# Patient Record
Sex: Female | Born: 1965 | Race: White | Hispanic: No | Marital: Single | Smoking: Current every day smoker
Health system: Southern US, Community
[De-identification: ages and names within clinical notes are randomized; demographics above are authoritative.]

---

## 2000-06-26 ENCOUNTER — Other Ambulatory Visit: Admission: RE | Admit: 2000-06-26 | Discharge: 2000-06-26 | Payer: Self-pay | Admitting: *Deleted

## 2000-09-25 ENCOUNTER — Encounter: Admission: RE | Admit: 2000-09-25 | Discharge: 2000-12-24 | Payer: Self-pay | Admitting: Gynecology

## 2000-12-07 ENCOUNTER — Inpatient Hospital Stay (HOSPITAL_COMMUNITY): Admission: AD | Admit: 2000-12-07 | Discharge: 2000-12-10 | Payer: Self-pay | Admitting: Gynecology

## 2001-01-23 ENCOUNTER — Other Ambulatory Visit: Admission: RE | Admit: 2001-01-23 | Discharge: 2001-01-23 | Payer: Self-pay | Admitting: Gynecology

## 2001-06-15 ENCOUNTER — Inpatient Hospital Stay (HOSPITAL_COMMUNITY): Admission: EM | Admit: 2001-06-15 | Discharge: 2001-07-02 | Payer: Self-pay

## 2001-06-15 ENCOUNTER — Encounter: Payer: Self-pay | Admitting: Neurosurgery

## 2001-06-15 ENCOUNTER — Encounter: Payer: Self-pay | Admitting: Emergency Medicine

## 2001-06-19 ENCOUNTER — Encounter: Payer: Self-pay | Admitting: Pulmonary Disease

## 2001-06-30 ENCOUNTER — Encounter: Payer: Self-pay | Admitting: Neurosurgery

## 2001-08-25 ENCOUNTER — Ambulatory Visit (HOSPITAL_COMMUNITY): Admission: RE | Admit: 2001-08-25 | Discharge: 2001-08-25 | Payer: Self-pay | Admitting: Interventional Radiology

## 2001-10-06 ENCOUNTER — Ambulatory Visit (HOSPITAL_COMMUNITY): Admission: RE | Admit: 2001-10-06 | Discharge: 2001-10-06 | Payer: Self-pay | Admitting: Interventional Radiology

## 2001-10-09 ENCOUNTER — Ambulatory Visit (HOSPITAL_COMMUNITY): Admission: RE | Admit: 2001-10-09 | Discharge: 2001-10-09 | Payer: Self-pay | Admitting: Interventional Radiology

## 2001-10-21 ENCOUNTER — Ambulatory Visit: Admission: RE | Admit: 2001-10-21 | Discharge: 2001-10-21 | Payer: Self-pay | Admitting: Interventional Radiology

## 2001-10-30 ENCOUNTER — Ambulatory Visit (HOSPITAL_COMMUNITY): Admission: RE | Admit: 2001-10-30 | Discharge: 2001-10-30 | Payer: Self-pay | Admitting: Interventional Radiology

## 2001-12-08 ENCOUNTER — Ambulatory Visit (HOSPITAL_COMMUNITY): Admission: RE | Admit: 2001-12-08 | Discharge: 2001-12-08 | Payer: Self-pay | Admitting: Interventional Radiology

## 2002-02-09 ENCOUNTER — Inpatient Hospital Stay (HOSPITAL_COMMUNITY): Admission: RE | Admit: 2002-02-09 | Discharge: 2002-02-10 | Payer: Self-pay | Admitting: Interventional Radiology

## 2002-04-14 ENCOUNTER — Ambulatory Visit (HOSPITAL_COMMUNITY): Admission: RE | Admit: 2002-04-14 | Discharge: 2002-04-15 | Payer: Self-pay | Admitting: Interventional Radiology

## 2002-07-17 ENCOUNTER — Ambulatory Visit (HOSPITAL_COMMUNITY): Admission: RE | Admit: 2002-07-17 | Discharge: 2002-07-17 | Payer: Self-pay | Admitting: Interventional Radiology

## 2002-12-02 ENCOUNTER — Inpatient Hospital Stay (HOSPITAL_COMMUNITY): Admission: RE | Admit: 2002-12-02 | Discharge: 2002-12-03 | Payer: Self-pay | Admitting: Interventional Radiology

## 2003-01-11 IMAGING — XA IR ANGIO/VERTEBRAL*L*
1 series · 12 of 24 positions shown · IV contrast (omnipaque)
Comparison: none

FINDINGS
CLINICAL DATA: PATIENT WITH A SEVERE ACUTE ONSET OF HEADACHES.  SUBARACHNOID HEMORRHAGE BY CT SCAN
OF THE BRAIN.
CAROTID AND CEREBRAL ARTERIOGRAM FOLLOWED BY ENDOVASCULAR OCCLUSION FOR A RIGHT PCOM ANEURYSM:
FOLLOWING A FULL EXPLANATION OF THE PROCEDURE ALONG WITH THE POTENTIAL ASSOCIATED COMPLICATIONS, AN
INFORMED WITNESSED CONSENT WAS OBTAINED FROM THE PATIENT'S HUSBAND.
 THE RIGHT GROIN WAS PREPPED AND DRAPED IN THE USUAL STERILE FASHION.  THEREAFTER, USING  MODIFIED
SELDINGER TECHNIQUE, TRANSFEMORAL ACCESS INTO THE RIGHT COMMON FEMORAL ARTERY WAS OBTAINED WITHOUT
DIFFICULTY.  OVER AN .035 INCH GUIDEWIRE, A SIX FRENCH PINNACLE SHEATH WAS INSERTED.  THROUGH THIS
AND ALSO OVER A .035 INCH GUIDEWIRE, A FIVE FRENCH JB-1 CATHETER WAS ADVANCED INTO THE AORTIC ARCH
REGION AND SELECTIVE CANNULATION ARTERIOGRAMS WERE PERFORMED OF THE RIGHT COMMON CAROTID ARTERY,
THE RIGHT VERTEBRAL ARTERY, THE LEFT COMMON CAROTID ARTERY, AND THE LEFT VERTEBRAL ARTERY, IN THAT
ORDER.  THERE WERE NO ACUTE COMPLICATIONS.
MEDICATIONS UTILIZED:  VERSED 1 MG IV X 1 AND FENTANYL 25 MG IV X 1 FOR SEDATION.
CONTRAST UTILIZED:  OMNIPAQUE 300, APPROXIMATELY 75 CC.

[Series 1: run · 12 of 198 slices shown]
[im 9/198]
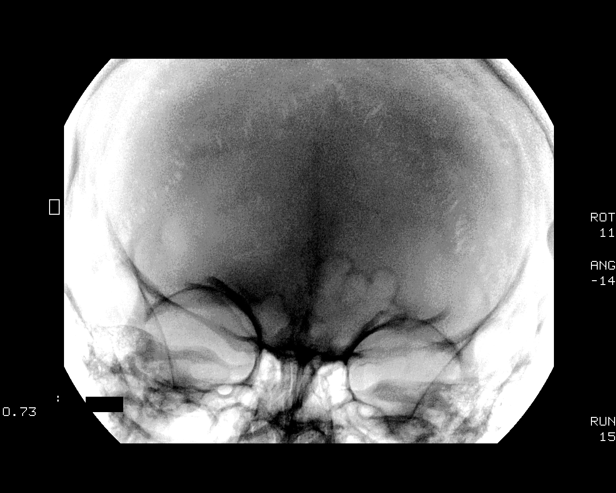
[im 26/198]
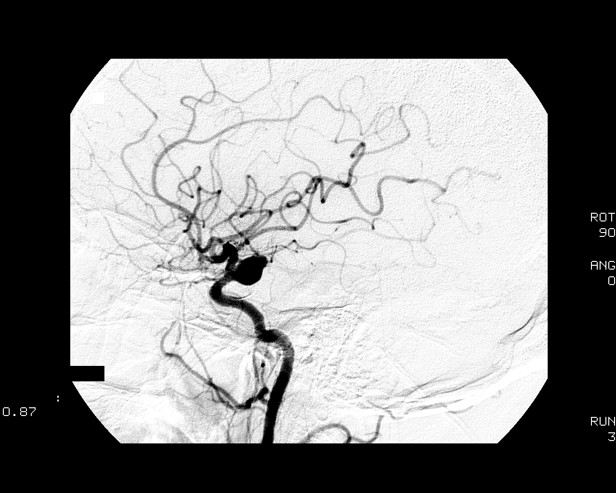
[im 43/198]
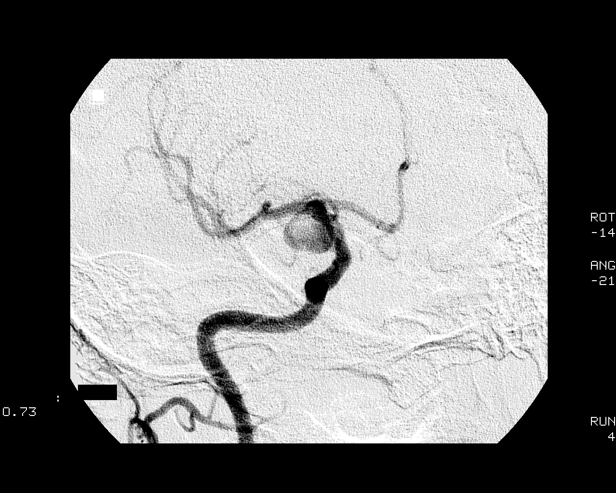
[im 60/198]
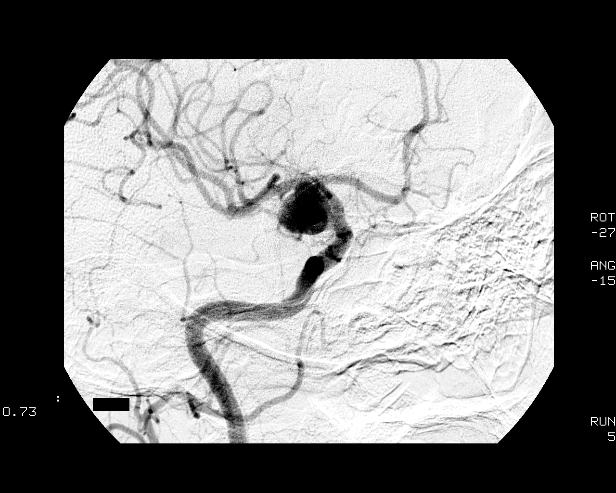
[im 78/198]
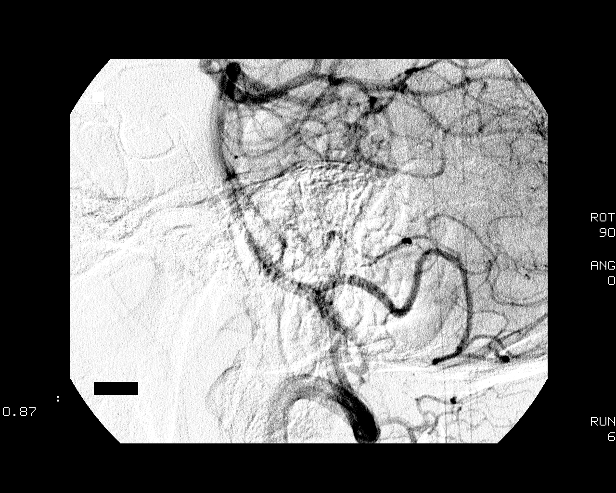
[im 95/198]
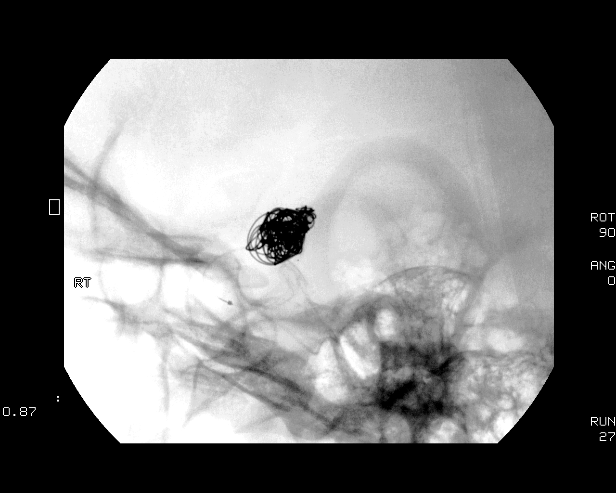
[im 112/198]
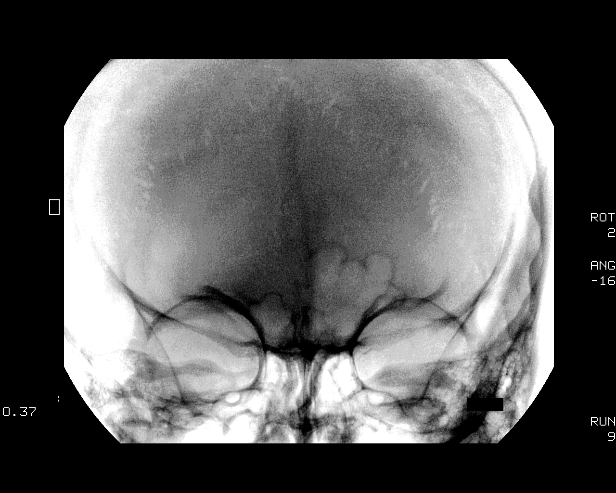
[im 129/198]
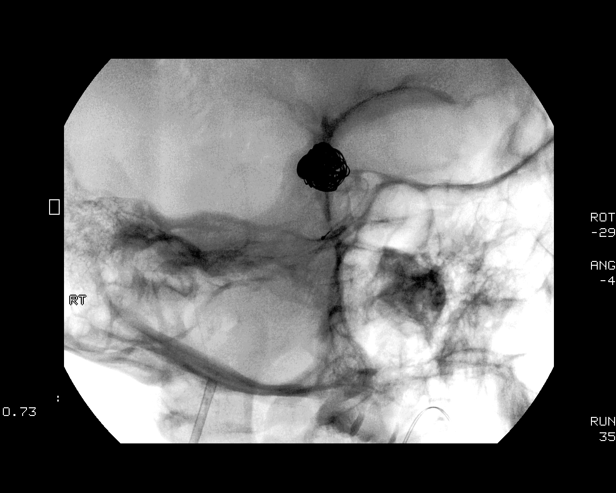
[im 146/198]
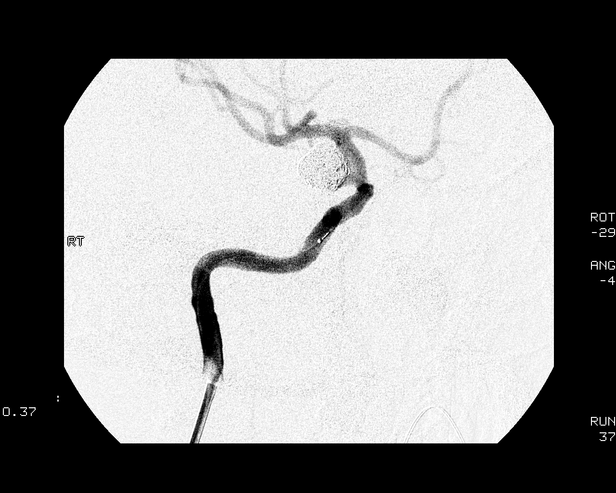
[im 163/198]
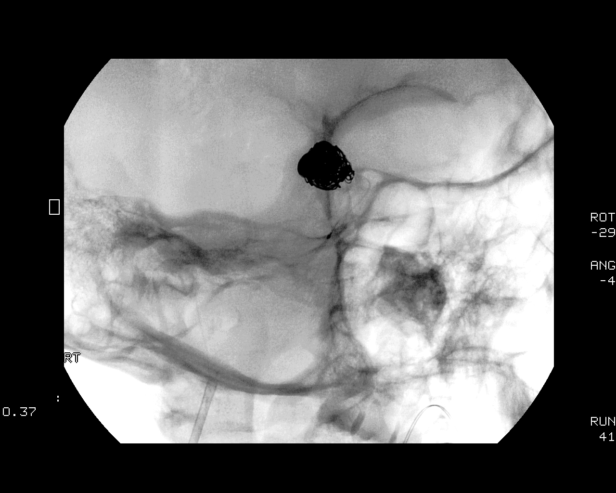
[im 180/198]
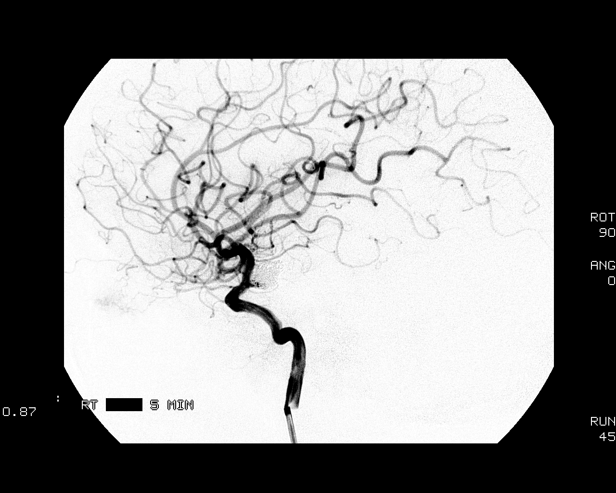
[im 198/198]
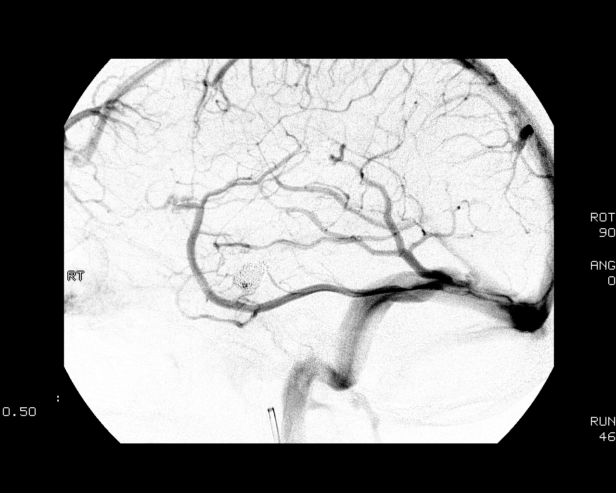

[12 of 24 positions shown; findings below may reference images not displayed]

FINDINGS: THE RIGHT VERTEBRAL  ARTERY ORIGIN IS NORMAL.  THE VESSEL ASCENDS NORMALLY  TO  THE CRANIAL SKULL
BASE. THE RIGHT POSTERIOR INFERIOR CEREBELLAR ARTERY, THE BASILAR ARTERY, THE POSTERIOR CEREBRAL
ARTERIES, THE SUPERIOR CEREBRAL ARTERIES, AND THE ANTERIOR INFERIOR CEREBELLAR ARTERIES ARE
NORMALLY OPACIFIED INTO THE CAPILLARY AND THE VENOUS PHASES.
THE RIGHT COMMON CAROTID ARTERIOGRAM DEMONSTRATES THE RIGHT EXTERNAL CAROTID ARTERY ORIGIN AND
BRANCHES TO BE NORMAL. THE RIGHT INTERNAL CAROTID ARTERY AT THE BULB AND DISTALLY IS NORMAL.  THE
PETROUS, CAVERNOUS, AND THE SUPRACLINOID SEGMENTS ARE UNREMARKABLE.
IN THE RIGHT PCOM REGION, THERE IS A LARGE UNILOBULAR SACCULAR ANEURYSM PROJECTING POSTERIORLY AND
SLIGHTLY LATERALLY.  THE RIGHT MIDDLE AND THE RIGHT ANTERIOR CEREBRAL ARTERIES HOWEVER, DEMONSTRATE
NORMAL CALIBER AND OPACIFICATION INTO THE CAPILLARY AND THE VENOUS PHASES. THERE IS NO CROSS
OPACIFICATION OF THE LEFT ANTERIOR CEREBRAL ARTERY DISTRIBUTION VIA THE ACOM.
THE LEFT CAROTID ARTERIOGRAM DEMONSTRATES THE LEFT EXTERNAL CAROTID ARTERY ORIGIN AND BRANCHES TO
BE NORMAL.
THE LEFT INTERNAL CAROTID ARTERY AT THE BULB AND DISTALLY IS NORMAL.  THE PETROUS AND CAVERNOUS
SEGMENTS ARE NORMAL.
IN THE LEFT PCOM REGION, THERE IS A 3 MM SACCULAR ANEURYSM PROJECTING POSTERIORLY AND SLIGHTLY
INFERIORLY.  THE LEFT SUPRACLINOID SEGMENT IS OTHERWISE NORMAL. THE LEFT MIDDLE AND THE LEFT
ANTERIOR CEREBRAL ARTERIES OPACIFY NORMALLY INTO THE CAPILLARY AND THE VENOUS PHASES.
THE LEFT VERTEBRAL ARTERY ORIGIN IS NORMAL. THE VESSEL ASCENDS NORMALLY TO THE CRANIAL SKULL BASE.
THE LEFT POSTERIOR INFERIOR CEREBELLAR ARTERY, THE BASILAR ARTERY, THE POSTERIOR CEREBRAL ARTERIES,
THE SUPERIOR CEREBELLAR ARTERIES, AND THE ANTERIOR INFERIOR CEREBELLAR ARTERIES ARE NORMALLY
OPACIFIED INTO THE CAPILLARY AND THE VENOUS PHASES.
THE ABOVE FINDINGS WERE DISCUSSED WITH DR. SEIFU HAJI YUYE AND THE PATIENT'S FAMILY.  SINCE THE ANEURYSM
WAS AMENABLE TO ENDOVASCULAR TREATMENT, THIS WAS OPTED FOR.
THE RISKS OF THE PROCEDURE INCLUDING THE BENEFITS WERE DISCUSSED WITH THE PATIENT'S HUSBAND.
ENDOVASCULAR OCCLUSION OF  RIGHT PCOM ARTERY ANEURYSM.
THE PATIENT WAS THEN PUT UNDER GENERAL ANESTHESIA BY THE [REDACTED] WITH
INTUBATION.
THE SIX FRENCH PINNACLE SHEATH IN THE RIGHT GROIN WAS THEN EXCHANGED FOR A SIX FRENCH, 45 CM
NEUROVASCULAR ARROW SHEATH OVER A .035 INCH GUIDEWIRE.
THIS WAS THEN CONNECTED TO CONTINUOUS HEPARIN AND SALINE INFUSION.
THROUGH THIS AND ALSO OVER A .035 INCH GUIDEWIRE, A FIVE FRENCH DIAGNOSTIC CATHETER WAS THEN
ADVANCED TO THE AORTIC ARCH REGION AND POSITIONED IN THE RIGHT COMMON CAROTID ARTERY OVER A .035
INCH GUIDEWIRE.
USING BIPLANE ROAD MAP TECHNIQUE AND WITH CONSTANT FLUOROSCOPIC GUIDANCE, THE DIAGNOSTIC CATHETER
WAS EXCHANGED FOR A SIX FRENCH STRAIGHT ENVOY, 90 CM GUIDE CATHETER OVER A .035 INCH 300 CM ROSEN
EXCHANGE GUIDEWIRE.
THE GUIDEWIRE WAS REMOVED AND A CONTROLLED ARTERIOGRAM THROUGH THE GUIDE CATHETER DEMONSTRATED NO
EVIDENCE OF SPASM, DISSECTIONS, OR INTRALUMINAL FILLING DEFECTS.
USING THE BIPLANE ROAD MAP TECHNIQUE AND WITH CONSTANT FLUOROSCOPIC GUIDANCE, OVER A .035 INCH
ROADRUNNER GUIDEWIRE, THE GUIDE CATHETER WAS ADVANCED INTO THE DISTAL ASPECT OF THE LEFT INTERNAL
CAROTID ARTERY.  THE GUIDEWIRE WAS REMOVED AND CONTROLLED ARTERIOGRAM DEMONSTRATED NO EVIDENCE OF
SPASM, DISSECTIONS, OR INTRALUMINAL FILLING DEFECTS.  THE INTRACRANIAL CIRCULATION REMAINED
UNCHANGED.
A QUARTER WAS THEN POSITIONED OVER THE RIGHT ORBIT.  THIS WAS THEN USED TO MEASURE THE DIMENSIONS
OF THE ANEURYSM.  THE ANEURYSM MEASURED APPROXIMATELY 12 MM X 8.4 MM X 11 MM.  IT HAD A NECK OF
APPROXIMATELY 2.7 MM.
AN EXCEL [id number], 2-TIP MICROCATHETER WAS THEN STEAM SHAPED IN THE USUAL FASHION.  OVER A .014 INCH
TRANSEND EX MICROGUIDEWIRE, THE COMBINATION OF THE MICROCATHETER AND THE  MICROGUIDEWIRE WITH A J-
TIP CONFIGURATION WAS ADVANCED INTO THE DISTAL ASPECT OF THE GUIDE CATHETER USING BIPLANE ROAD MAP
TECHNIQUE, IN A COAXIAL MANNER AND WITH CONSTANT HEPARINIZED SALINE INFUSION.
THE TWO WERE NAVIGATED THEREAFTER INTO THE DISTAL ASPECT OF THE CERVICAL INTERNAL CAROTID ARTERY,
THE CAVERNOUS SEGMENT, AND THE SUPRACLINOID SEGMENT WITHOUT ANY DIFFICULTY.  THE ANEURYSM WAS
ENTERED WITH THE MICROGUIDEWIRE LEADING.  THE MICROCATHETER WAS THEN ADVANCED OVER THE
MICROGUIDEWIRE AND POSITIONED WITHIN THE MIDDLE OF THE ANEURYSM.  THE MICROGUIDEWIRE WAS SLOWLY
REMOVED.  THERE WAS GOOD ASPIRATION OF BLOOD FROM THE MICROCATHETER. A CONTROLLED ARTERIOGRAM WAS
THEN PERFORMED THROUGH THE GUIDE CATHETER WHICH DEMONSTRATED SAFE POSITION OF THE MICROCATHETER TIP
FOR COILING TO BEGIN.
THE FIRST COIL UTILIZED WAS [REDACTED] 10, 3-D 10 MM X 30 CM.  THIS WAS PREPPED AND FLUSHED IN THE USUAL
FASHION.  IN A COAXIAL MANNER, THE COIL WAS ADVANCED USING BIPLANE ROAD MAP TECHNIQUE AND WITH
CONSTANT FLUOROSCOPIC GUIDANCE. THE COIL WAS ADVANCED PAST THE MICROCATHETER TIP, AND A  BASKET
WAS FORMED.  A CONTROLLED ARTERIOGRAM WAS THEN PERFORMED THROUGH THE GUIDE CATHETER, WHICH
DEMONSTRATED NO EVIDENCE OF COIL PROTRUDING THROUGH THE NECK OF THE ANEURYSM.
THIS WAS THEN DETACHED WITHOUT ANY DIFFICULTY.  THIS WAS THEN FOLLOWED BY [REDACTED] 10, 3-D 8 MM X 20
CM COIL, [REDACTED] 10, 2-D TO 7 MM X 25 CM, [REDACTED] 10, 2-D 5 MM X 15 CM, AND [REDACTED] 10, SOFT 2-D STRETCH
RESISTANT 5 MM X 10 CM, [REDACTED] 10 SOFT 2-D SR 4 MM X 8 CM, A 2-D STRETCH RESISTANT [REDACTED] 4 MM X 6 CM,
[REDACTED] 2-D SR, 3 MM X 8 CM, [REDACTED] 10 2-D SR 3 MM X 6 CM, [REDACTED] ULTRASOFT 2.5 MM X 6 CM, [REDACTED]
ULTRASOFT 2.5 MM  X 6 CM, [REDACTED] ULTRASOFT 2.5 MM X 4 CM, [REDACTED] 10, 2-D SR 3 MM X 6 CM, [REDACTED] 10
SOFT 2-D SR 3 MM X 6 CM, [REDACTED] ULTRASOFT 2 MM X 6 CM, [REDACTED] 10 SOFT 2-D SR 3 MM X 4 CM, AND
FINALLY, [REDACTED] 2 MM X 6 CM ULTRASOFT COIL.  THE ATTEMPT AT POSITIONING A SUBSEQUENT 2 MM X 3 CM
COIL RESULTED IN RESISTANCE WITH BACKING OUT OF THE MICROCATHETER FROM THE ANEURYSM.
THIS COIL WAS THEREFORE NOT DETACHED.
EACH OF THE COILS WAS ADVANCED INTO THE ANEURYSM USING BIPLANE ROAD MAP TECHNIQUE AND CONSTANT
FLUOROSCOPIC GUIDANCE.  PRIOR TO THE DETACHMENT OF EACH COIL, A CONTROLLED ARTERIOGRAM WAS
PERFORMED THOROUGH THE GUIDE CATHETER TO ENSURE THE SAFE POSITIONING OF THE [REDACTED] COILS FOR
DEPLOYMENT.
THE FINAL CONTROLLED ARTERIOGRAM PERFORMED THROUGH THE GUIDE CATHETER DEMONSTRATED NEAR COMPLETE
OBLITERATION OF THE ANEURYSM.  THERE WAS A MINIMAL AMOUNT OF CONTRAST NOTED STAGNANT IN THE
INFERIOR ASPECT OF THE NECK OF THE ANEURYSM WITH DELAYED CLEARANCE.
USING BIPLANE ROAD MAP TECHNIQUE AND WITH CONSTANT FLUOROSCOPIC GUIDANCE, THE MICROCATHETER WAS
REMOVED.  THE GUIDE CATHETER WAS POSITIONED IN THE RIGHT COMMON CAROTID ARTERY.  AN ARTERIOGRAM
PERFORMED THROUGH THIS DEMONSTRATED NO EVIDENCE OF INTRALUMINAL FILLING DEFECTS IN THE ANTERIOR
CIRCULATION.  NO EVIDENCE OF DISSECTIONS WERE NOTED.  AGAIN NOTED WAS THE VERY MINIMAL AMOUNT OF
CONTRAST IN THE INFERIOR ASPECT OF THE NECK OF THE ANEURYSM WHICH REMAINS STAGNANT WITH DELAYED
CLEARANCE.
THE GUIDE CATHETER WAS REMOVED AND A SHORT SIX FRENCH PINNACLE SHEATH WAS THEN INSTITUTED FOR THE
LONG 45 CM NEUROVASCULAR SHEATH.
THIS WAS THEN CONNECTED TO CONTINUOUS HEPARINIZED SALINE INFUSION.
THE PATIENT WAS THEN TAKEN TO THE CT SCAN FOR A HEAD CT.
MEDICATIONS UTILIZED DURING THE PROCEDURE:  NO HEPARIN WAS UTILIZED.  OTHERWISE MEDICATIONS AS PER
ANESTHESIA.
IMPRESSION
LARGE, UNILOCULAR RIGHT PCOM ARTERY ANEURYSM AS DESCRIBED ABOVE.
APPROXIMATELY 3 MM X 3 MM LEFT PCOM ARTERY ANEURYSM.
NO ANGIOGRAPHIC EVIDENCE OF VASOSPASM NOTED.
STATUS POST  ENDOVASCULAR  OCCLUSION OF THE RIGHT PCOM ANEURYSM AS DESCRIBED WITHOUT EVENT.

## 2003-05-04 IMAGING — XA IR ANGIO/CAROTID/CERV BI
1 series · 12 of 24 positions shown · IV contrast (omnipaque)
Comparison: none

FINDINGS
CLINICAL DATA: PATIENT WITH INTRACRANIAL ANEURYSMS STATUS POST ENDOVASCULAR OCCLUSION OF ONE
ANEURYSM, EVALUATE THE OTHER ANEURYSM FOR CHANGES.
CAROTID AND CEREBRAL ARTERIOGRAMS:
FOLLOWING A FULL EXPLANATION OF THE PROCEDURE, ALONG WITH THE POTENTIAL ASSOCIATED COMPLICATIONS,
AN INFORMED WITNESSED CONSENT WAS OBTAINED.
THE RIGHT GROIN WAS PREPPED AND DRAPED IN THE USUAL STERILE FASHION.  THEREAFTER USING THE MODIFIED
SELDINGER TECHNIQUE, TRANSFEMORAL ACCESS INTO THE RIGHT COMMON FEMORAL ARTERY WAS ACCESSED WITHOUT
DIFFICULTY.  OVER A .035 INCH GUIDEWIRE, A 5 FRENCH PINNACLE SHEATH WAS INSERTED.
THROUGH THIS, AND ALSO OVER A .035 INCH GUIDEWIRE, A 5 FRENCH JB-I CATHETER WAS ADVANCED INTO THE
AORTIC ARCH REGION AND SELECTIVE CANNULATION ARTERIOGRAMS WERE PERFORMED OF THE RIGHT COMMON
CAROTID AND THE LEFT COMMON CAROTID ARTERY. THERE WERE NO ACUTE COMPLICATIONS AND THE PATIENT
TOLERATED THE PROCEDURE WELL.
MEDICATIONS UTILIZED:  VERSED 1 MG IV, FENTANYL 25 MG IV.
CONTRAST UTILIZED:  OMNIPAQUE 300, APPROXIMATELY 50 CC.

[Series 1: run · 12 of 49 slices shown]
[im 3/49]
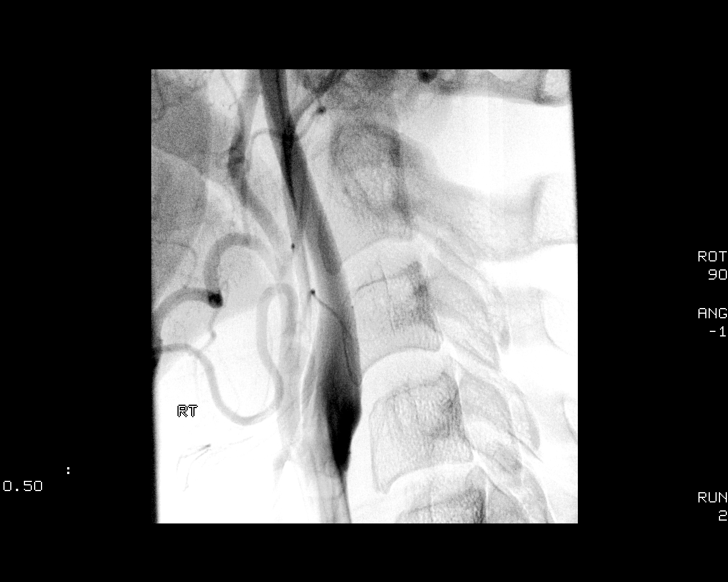
[im 7/49]
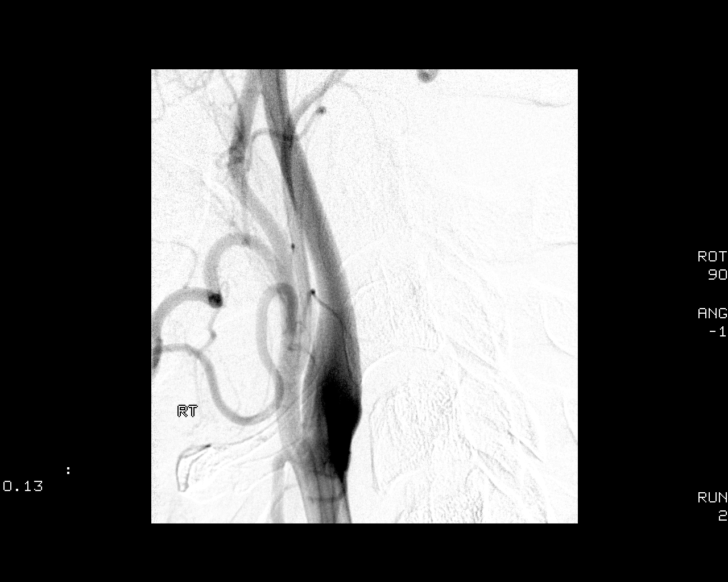
[im 11/49]
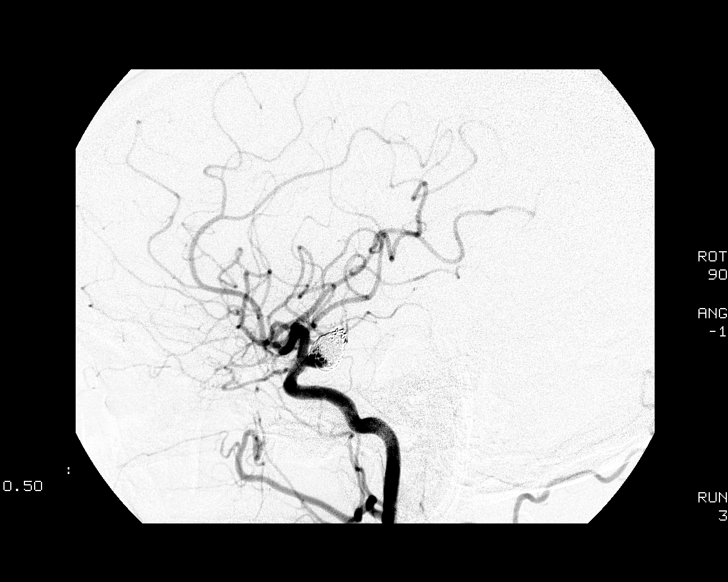
[im 15/49]
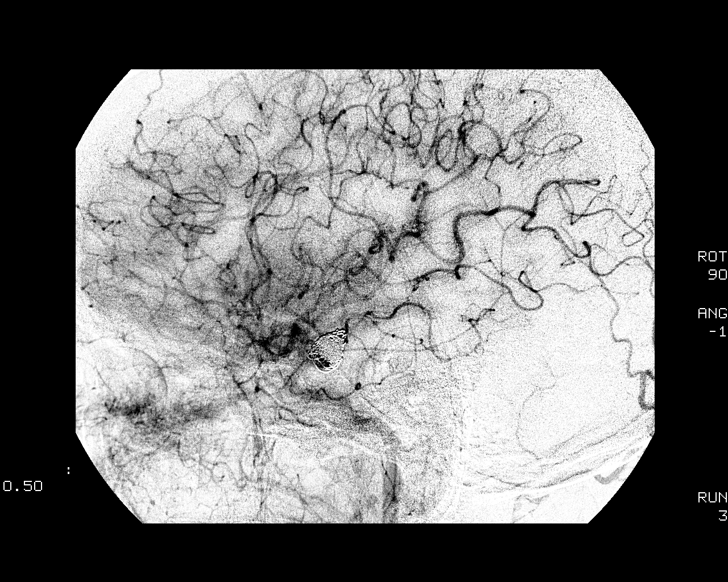
[im 19/49]
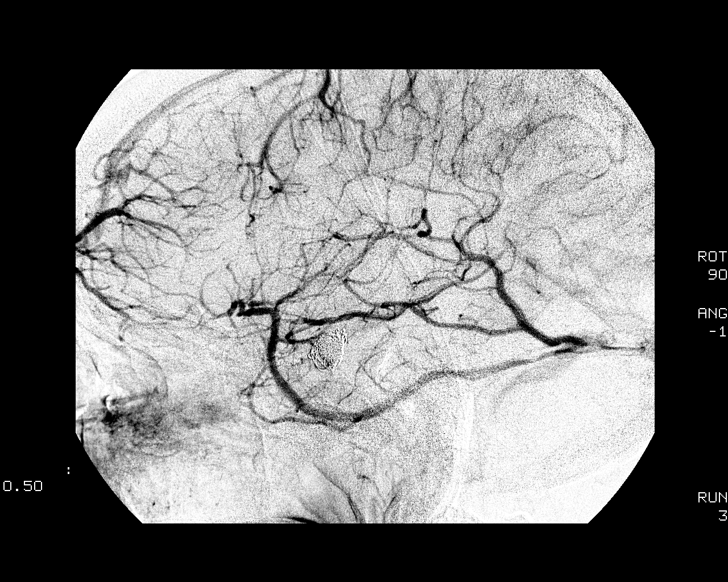
[im 23/49]
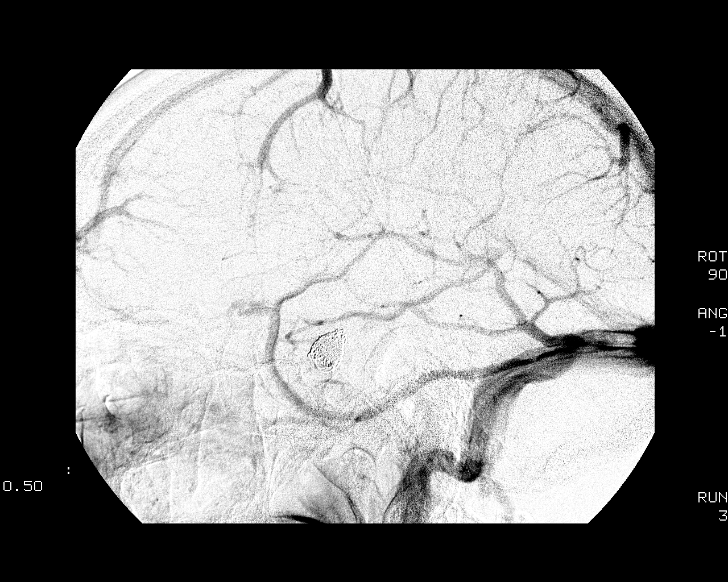
[im 28/49]
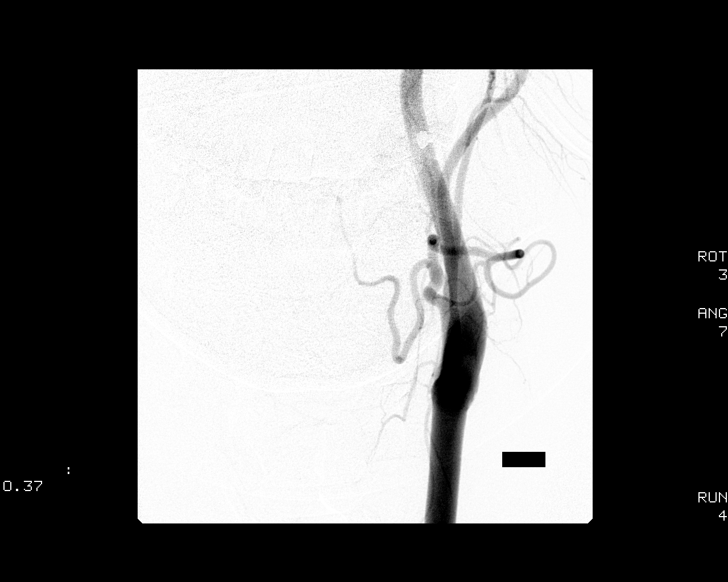
[im 32/49]
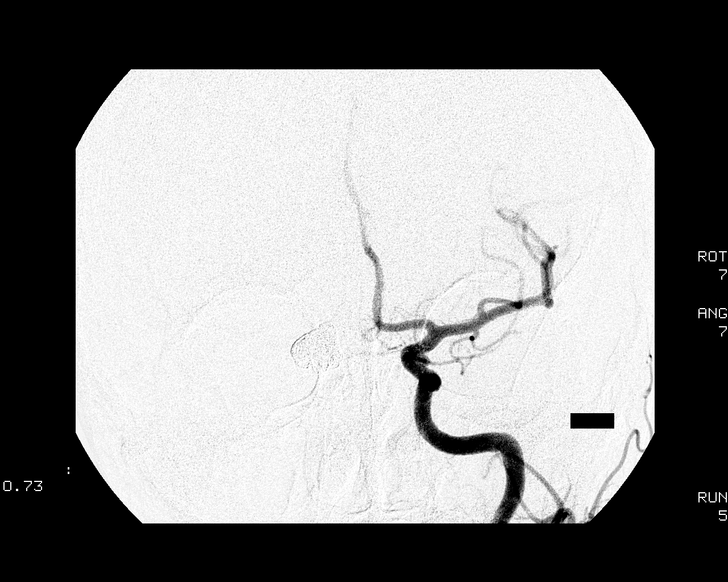
[im 36/49]
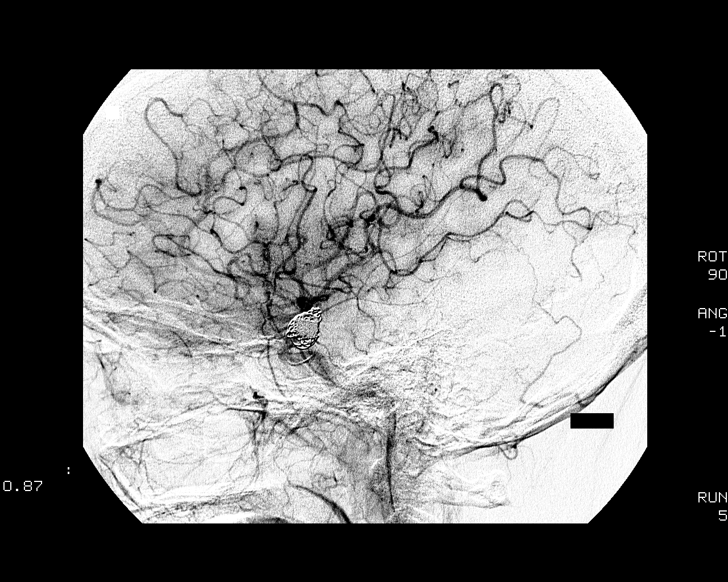
[im 40/49]
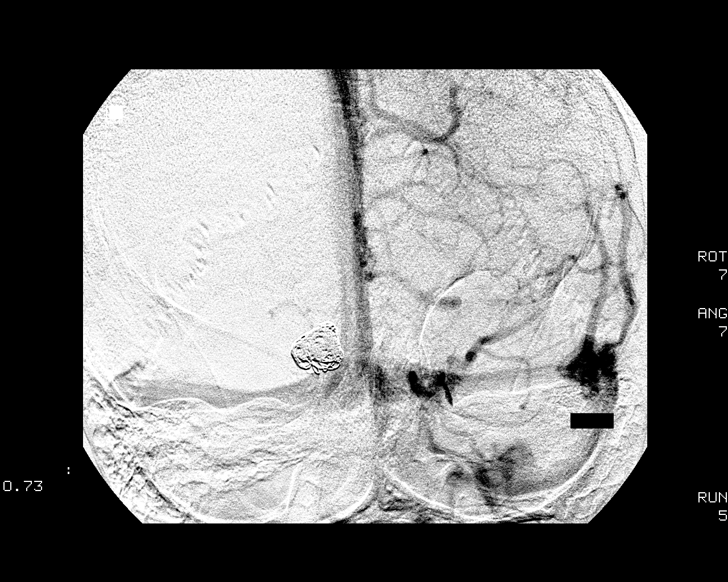
[im 44/49]
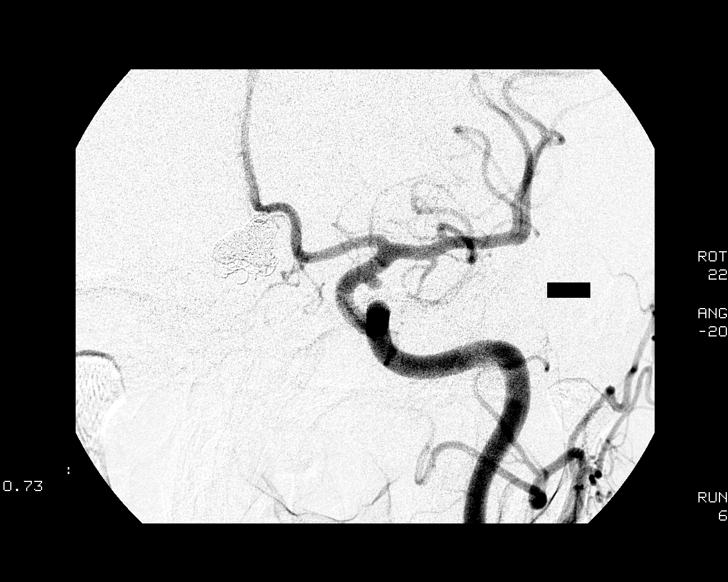
[im 49/49]
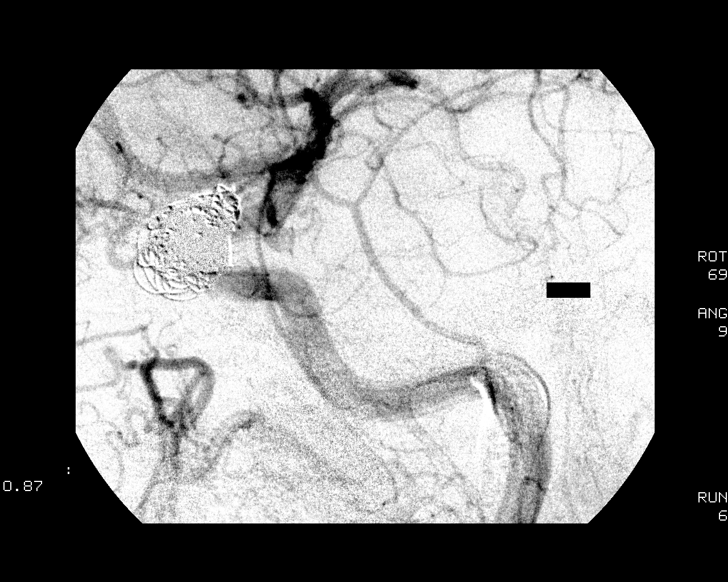

[12 of 24 positions shown; findings below may reference images not displayed]

FINDINGS: THE RIGHT COMMON CAROTID ARTERIOGRAM  DEMONSTRATES THE RIGHT EXTERNAL CAROTID ARTERY
ORIGIN AND BRANCHES TO BE NORMAL.  THE RIGHT INTERNAL CAROTID ARTERY ASCENDS NORMALLY INTO THE
CRANIAL SKULL BASE.   IN THE LEFT PCOM REGION, THERE IS AN APPROXIMATELY 4 MM TUBULAR OPACIFICATION
AT THE NECK OF THE PREVIOUSLY COILED LARGE PCOM ARTERY ANEURYSM.  THE VESSELS DISTAL TO THIS ARE
NORMAL.  THE  MIDDLE AND THE ANTERIOR CEREBRAL ARTERIES ARE NORMAL INTO THE CAPILLARY AND VENOUS
PHASES.
THE LEFT COMMON CAROTID ARTERIOGRAM DEMONSTRATES THE LEFT EXTERNAL CAROTID ARTERY AND BRANCHES TO
BE NORMAL.
THE LEFT INTERNAL CAROTID ARTERY OF THE BULB AND DISTALLY IS NORMAL.  THE LEFT PETROUS AND THE
CAVERNOUS SEGMENTS ARE NORMAL.  IN THE LEFT PCOM REGION, THERE IS A 2.5 TO 3 MM PCOM ARTERY
ANEURYSM WITH A RELATIVELY WIDE NECK.  THE VESSEL IS UNCHANGED FROM THE PREVIOUS EXAMINATION.
THE LEFT MIDDLE AND THE LEFT ANTERIOR CEREBRAL ARTERIES OPACIFY NORMALLY INTO THE CAPILLARY AND
VENOUS PHASES.
IMPRESSION
PARTIAL OPACIFICATION AT THE NECK OF THE PREVIOUSLY TREATED RIGHT PCOM ARTERY ANEURYSM POSSIBLY
RELATED TO COIL COMPACTION.
NO CHANGE IN THE 2.5 TO 3 MM LEFT PCOM ARTERY ANEURYSM.

## 2003-07-23 ENCOUNTER — Ambulatory Visit (HOSPITAL_COMMUNITY): Admission: RE | Admit: 2003-07-23 | Discharge: 2003-07-23 | Payer: Self-pay | Admitting: Interventional Radiology

## 2003-07-28 ENCOUNTER — Ambulatory Visit (HOSPITAL_COMMUNITY): Admission: RE | Admit: 2003-07-28 | Discharge: 2003-07-28 | Payer: Self-pay | Admitting: Interventional Radiology

## 2003-08-10 ENCOUNTER — Ambulatory Visit (HOSPITAL_COMMUNITY): Admission: RE | Admit: 2003-08-10 | Discharge: 2003-08-10 | Payer: Self-pay | Admitting: Interventional Radiology

## 2004-02-14 ENCOUNTER — Ambulatory Visit (HOSPITAL_COMMUNITY): Admission: RE | Admit: 2004-02-14 | Discharge: 2004-02-14 | Payer: Self-pay | Admitting: Interventional Radiology

## 2004-08-28 ENCOUNTER — Ambulatory Visit (HOSPITAL_COMMUNITY): Admission: RE | Admit: 2004-08-28 | Discharge: 2004-08-28 | Payer: Self-pay | Admitting: Interventional Radiology

## 2004-09-03 ENCOUNTER — Emergency Department (HOSPITAL_COMMUNITY): Admission: EM | Admit: 2004-09-03 | Discharge: 2004-09-03 | Payer: Self-pay | Admitting: Emergency Medicine

## 2005-04-09 ENCOUNTER — Ambulatory Visit (HOSPITAL_COMMUNITY): Admission: RE | Admit: 2005-04-09 | Discharge: 2005-04-09 | Payer: Self-pay | Admitting: Interventional Radiology

## 2006-04-15 ENCOUNTER — Ambulatory Visit (HOSPITAL_COMMUNITY): Admission: RE | Admit: 2006-04-15 | Discharge: 2006-04-15 | Payer: Self-pay | Admitting: Interventional Radiology

## 2008-04-13 ENCOUNTER — Ambulatory Visit (HOSPITAL_COMMUNITY): Admission: RE | Admit: 2008-04-13 | Discharge: 2008-04-13 | Payer: Self-pay | Admitting: Interventional Radiology

## 2008-04-15 ENCOUNTER — Encounter: Payer: Self-pay | Admitting: Interventional Radiology

## 2009-05-06 ENCOUNTER — Ambulatory Visit (HOSPITAL_COMMUNITY): Admission: RE | Admit: 2009-05-06 | Discharge: 2009-05-06 | Payer: Self-pay | Admitting: Interventional Radiology

## 2011-02-06 NOTE — Consult Note (Signed)
NAME:  Funes, Jalaine                   ACCOUNT NO.:  [id number]   MEDICAL RECORD NO.:  [id number]          PATIENT TYPE:  OUT   LOCATION:  XRAY                         FACILITY:  [facility]   PHYSICIAN:  Sanjeev K. Deveshwar, M.D.DATE OF BIRTH:  May 18, 1966   DATE OF CONSULTATION:  04/15/2008  DATE OF DISCHARGE:  04/15/2008                                 CONSULTATION   DATE OF CONSULT:  April 15, 2008   CHIEF COMPLAINT:  Cerebral aneurysm.   HISTORY OF PRESENT ILLNESS:  This is a very pleasant 45 year old female  who was initially referred to Dr. Corliss Skains through the courtesy of Dr.  Trey Sailors in September 2002 when the patient presented with a  subarachnoid hemorrhage.  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  She had a repeat coiling of  this aneurysm in January 2003.  In July 2003, she had a repeat coiling  with stent placement.  In March 2004, she underwent stent-assisted  coiling of a left posterior communicating artery aneurysm that was noted  at the time of the initial subarachnoid hemorrhage.  The patient has  been followed over the years with angiograms and MRIs.  She returns  today to discuss the results of her most recent MRI, which was performed  on April 13, 2008.  She is accompanied by her husband and her son.   PAST MEDICAL HISTORY:  Significant for the above-noted subarachnoid  hemorrhage and the aneurysm coilings.  She has otherwise been healthy.   PAST SURGICAL HISTORY:  The patient is status post tubal ligation.  She  has had nausea in the past associated with anesthesia.   CURRENT MEDICATIONS:  The patient is not on any medications.   ALLERGIES:  She has no known drug allergies.   SOCIAL HISTORY:  The patient is married.  She has 2 children.  She lives  in [place] with her husband.  She continues to smoke a pack of  cigarettes per day and has done so for 20-30 years.  She has an  occasional glass of wine.  She is a  housewife.   FAMILY HISTORY:  The patient's mother is alive and well in her 79s.  The  patient's father is alive and well in his 6s.  She has no siblings.   IMPRESSION AND PLAN:  As noted, the patient presents today for further  followup regarding her cerebral aneurysms.  She recently had an MRI/MRA  performed on April 13, 2008.  This did show an enlargement of the neck of  the right posterior communicating artery aneurysm.  It was estimated at  4.5 mm x 3.1 mm.  Her previous MRI/MRA performed in July 2007 had also  shown a small neck remnant; however, it measured 1.5 mm at that time, so  there has been a significant increase in its size.   The patient has been asymptomatic.  She has occasional headaches, but  she believes these are associated with changes in the weather.  She has  had no neurologic symptoms.   As noted, the  patient does continue to smoke.  Dr. Corliss Skains has  recommended that she quit smoking.  He also feels that no further  intervention is indicated at this time.  He recommends repeating an  MRI/MRA in approximately 1-year, unless the patient develops new  neurologic-type symptoms.  She has been told to call us if she has any  new symptoms.  She also will monitor her headaches with a journal to see  if they are increasing in frequency or severity.  If she should develop  symptoms, Dr. Corliss Skains will plan to add additional coils and possible  stents to the right posterior communicating artery aneurysm.  The  aneurysm on the left appears to be stable.   Greater than 15 minutes were spent on this followup visit.       Delton See, P.A.    ______________________________  Grandville Silos. Corliss Skains, M.D.    DR/MEDQ  D:  04/15/2008  T:  04/16/2008  Job:  [id number]   cc:   Payton Doughty, M.D.

## 2011-02-09 NOTE — Discharge Summary (Signed)
[place]. [facility]  Patient:    ROHLFS, Allycia Visit Number: [id number] MRN: [id number]          Service Type: SUR Location: [id number] Attending Physician:  Emeterio Reeve Dictated by:   Payton Doughty, M.D. Admit Date:  06/15/2001 Discharge Date: 07/02/2001                             Discharge Summary  ADMISSION DIAGNOSIS:  Subarachnoid hemorrhage.  DISCHARGE DIAGNOSIS:  Subarachnoid hemorrhage.  PROCEDURE:  Coiling of posterior communicating artery aneurysm.  SERVICE:  Neurosurgery.  DISCHARGE STATUS:  Alive and well.  COMPLICATIONS:  None.  HISTORY OF PRESENT ILLNESS:  This is a 45 year old right-handed white girl whose history and physical is recounted in the chart.  She had a headache at 2 in the morning and 6 in the morning on the day of admission, nausea and vomiting, no deficit described.  CT showed a diffuse subarachnoid hemorrhage with early hydrocephalus.  She was neurologically intact.  There was a grade I subarachnoid hemorrhage.  HOSPITAL COURSE:  She was taken to the angiography suite and angiography revealed a large right posterior communicating artery aneurysm as the source of bleeding. There was also a small left posterior communicating artery aneurysm.  The right communicating artery aneurysm was coiled by Dr. Corliss Skains.  Because of burgeoning hydrocephalus, a ventriculostomy was placed after the coiling procedure.  She was allowed to awaken and awoke neurologically intact.  She then began a period of convalescence in the neurosurgical ICU.  She had a central line started and was given hypervolemia and blood pressure was kept above 140 with dopamine.  Transcranial Dopplers increased on approximately day #8 after the bleed and reached their peak at approximately day #9 of the bleed and then gradually been declining down to near baseline levels. The right middle cerebral artery has remained slightly elevated.  During this  time, the patient was completely asymptomatic.  She had some mild headache but no evidence of neurologic deficit.  The dopamine was stopped. The ventriculostomy was removed after CT scanning did not demonstrate any ventriculomegaly after being clamped for 48 hours.  Dopamine was then stopped.  Hypervolemia was stopped.  After this, she remains neurologically intact, alert and oriented, pupils are equal, round and reactive to light, extraocular movements intact, facial movement and sensation intact. Tongue is in the midline.  Shoulder shrug is normal. No pronator drift.  She is now being discharged home with Motrin for a very mild headache.  Her follow-up will be in the [facility] in about two weeks.  Long range plans include angiography probably after the first of the year to see if she is a candidate for coiling the other aneurysm or probably for craniotomy for clipping. Dictated by:   Payton Doughty, M.D. Attending Physician:  Emeterio Reeve DD:  07/02/01 TD:  07/02/01 Job: [id number]

## 2011-02-09 NOTE — H&P (Signed)
[place]  Patient:    [name], [name] Visit Number: [id number] MRN: [id number]          Service Type: Attending:  Payton Doughty, M.D. Dictated by:   Payton Doughty, M.D. Adm. Date:  06/15/01                           History and Physical  ADMITTING DIAGNOSIS:  Subarachnoid hemorrhage, grade 1.  HISTORY OF PRESENT ILLNESS:  A 45 year old right-handed white female with a headache at 2 a.m. and 6 a.m. this morning, had developed nausea and vomiting but no deficit described neurologically.  She came to [facility] and CT showed diffuse subarachnoid hemorrhages and early hydrocephalus.  PAST MEDICAL HISTORY:  Unremarkable.  PAST SURGICAL HISTORY:  Toe ligament repair.  ALLERGIES:  None.  MEDICATIONS:  None.  SOCIAL HISTORY:  Does not smoke or drink.  Homemaker.  FAMILY HISTORY:  Unknown.  REVIEW OF SYSTEMS:  Remarkable for a headache, nausea, and vomiting.  PHYSICAL EXAMINATION:  HEENT:  Within normal limits.  NECK:  A little bit of nuchal rigidity.  CHEST:  Clear.  CARDIOVASCULAR:  Regular rate and rhythm.  ABDOMEN:  Nontender.  No hepatosplenomegaly.  EXTREMITIES:  Without clubbing or cyanosis.  Peripheral pulses are good.  NEUROLOGICAL:  She is awake, alert, and oriented.  She has some mild photophobia.  Her pupils are equal, round, and reactive to light.  Extraocular movements are intact.  Facial movement and sensation are intact.  Shoulder shrug is normal.  Palate elevates symmetrically.  Tongue is in the midline. Motor exam shows 5/5 strength throughout the upper and lower extremities with no drift.  There is no current sensory deficits.  Reflexes are 1 throughout the upper and lower extremities with downgoing toes.  LABORATORY DATA:  CT shows diffuse subarachnoid hemorrhage with probably AP aneurysm on the right side.  IMPRESSION:  Grade 1 subarachnoid hemorrhage.  PLAN:  Plan is for angiography.  She will  either have the aneurysm coiled or operated on.  She may need a ventriculostomy. Dictated by:   Payton Doughty, M.D. Attending:  Payton Doughty, M.D. DD:  06/15/01 TD:  06/15/01 Job: [id number]

## 2011-02-09 NOTE — Discharge Summary (Signed)
[facility]  PatientAlyra [name], [name] [name]                            MRN: [id number] Adm. Date:  [id number] Disc. Date: [id number] Attending:  Merrily Pew Dictator:   Antony Contras, [facility]                           Discharge Summary  DISCHARGE DIAGNOSES:          1. Intrauterine pregnancy at 37-38 weeks.                               2. Gestational diabetes, diet controlled.  PROCEDURES:                   1. Normal spontaneous vaginal delivery of viable                                  infant.                               2. McRoberts procedure and suprapubic pressure                                  to relieve mild shoulder dystocia.                               3. Postpartum tubal sterilization.  HISTORY OF PRESENT ILLNESS:   The patient is a 45 year old, gravida 3, para 1-0-1-1, LMP March 17, 2000, [facility] December 23, 2000. Prenatal risk factors included history of gestational diabetes, diet controlled. History of CIN-1.  LABORATORY DATA:              Blood type B positive, antibody screen negative. RPR, HBsAg, HIV nonreactive. MSAFP normal. GBS negative.  HOSPITAL COURSE AND TREATMENT:                The patient was admitted on December 09, 1999 with irregular uterine contractions. Cervix was 4 cm. Labor did progress to complete dilatation. She delivered an Apgar 9/9 female infant weighing 9 pounds 4 ounces over an intact perineum. There was mild shoulder dystocia which was relieved with McRoberts procedure and suprapubic pressure. Delivery was performed by Dr. Audie Box. Postpartum sterilization was performed on December 09, 2000 by Dr. Audie Box under epidural anesthesia. Findings included normal tubes, ovaries.  Postpartum course: The patient remained afebrile, had no difficulty voiding, was able to be discharged on her second postpartum day in stable condition. CBC: Hematocrit 29.3, hemoglobin 10.4, WBCs 17.6, platelets 247,000.  DISPOSITION:                   Follow up in six weeks, continue prenatal vitamins and iron, and check blood sugars prior to postpartum visit. DD:  12/23/00 TD:  12/23/00 Job: [id number]

## 2011-02-09 NOTE — Op Note (Signed)
[facility]  PatientJAXON, [name] [name]                            MRN: [id number] Proc. Date: 12/09/00 Adm. Date:  [id number] Attending:  Merrily Pew                           Operative Report  PREOPERATIVE DIAGNOSIS:         Postpartum, desires permanent sterilization.  POSTOPERATIVE DIAGNOSIS:        Postpartum, desires permanent sterilization.  OPERATION:                      Postpartum tubal sterilization, modified Pomeroy technique.  SURGEON:                        Timothy P. Fontaine, M.D.  ANESTHESIA:                     Epidural.  COMPLICATIONS:                  None.  SPECIMENS:                      Portions of right and portions of left fallopian tube.  FINDINGS:                       Normal fallopian tubes bilaterally with normal ovaries to limited inspection.  DESCRIPTION OF PROCEDURE:       The patient was taken to the operating room and had her epidural catheter dosed.  She received an abdominal preparation with Betadine solution and was draped in the usual fashion.  After ensuring adequate anesthesia, the abdomen was sharply entered through a small infraumbilical incision noting adequate hemostasis at all levels.  The left fallopian tube was then identified, grasped with a Babcock clamp and delivered through the incision.  The fallopian tube was traced from its insertion to its fimbriated end with identification and inspection of the associated ovary and subsequently a mid tubal segment was doubly ligated using 0 plain suture and the intervening segment sharply incised.  Tubal lumen as well as adequate hemostasis was grossly visualized.  A similar procedure was carried out on the other side.  The anterior fascia was then reapproximated using 0 Vicryl suture in a running stitch and the skin was reapproximated with 4-0 Vicryl in a running subcuticular stitch.  Steri-Strips were applied.  Sterile dressing was applied and  the patient was then taken to the recovery room in good condition having tolerated the procedure well. DD:  12/09/00 TD:  12/09/00 Job: [id number]

## 2011-02-09 NOTE — H&P (Signed)
NAME:  [name] [name], [name] [name]                   ACCOUNT NO.:  [id number]   MEDICAL RECORD NO.:  [id number]          PATIENT TYPE:  OIB   LOCATION:  2899                         FACILITY:  [facility]   PHYSICIAN:  Sanjeev K. Deveshwar, M.D.DATE OF BIRTH:  June 08, 1966   DATE OF ADMISSION:  04/09/2005  DATE OF DISCHARGE:                                HISTORY & PHYSICAL   CHIEF COMPLAINT:  The patient is here for a cerebral angiogram today.   HISTORY OF PRESENT ILLNESS:  This is a pleasant 45 year old female who had a  grade 1 subarachnoid hemorrhage in September 2002 and was admitted to [facility] by Dr. Trey Sailors. 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 She underwent coiling of the right posterior  communicating artery aneurysm during that admission.  In January 2003 she  had recannulization of the aneurysm and underwent repeat coiling of the  right posterior communicating artery aneurysm in July 2003. Again, the  aneurysm was found to be recannulized and she had repeat coiling with  placement of a Aneuroform stent. In March 2004 the patient had coiling of  the left posterior communicating artery aneurysm. In December 2005 the  patient had an angiogram. The right posterior communicating artery aneurysm  showed no evidence of quell compaction. There was stable presence of  opacification in the region of the neck of the aneurysm. There was minimal  opacification in the region of the small left posterior communicating region  aneurysm. The patient has continued to smoke. Dr. Corliss Skains has discussed  this with her and has encouraged her to quit; however, she has not been able  to to this point. She returns now for repeat angiogram to further evaluate  her aneurysms. She tells me that she has had some occasional headaches  behind her right eye over the past two weeks which have been  intermittent  and described as mild. She had one episode of right arm numbness that  occurred approximately one month ago, but lasted less than a minute.   PAST MEDICAL HISTORY:  Significant for the above, subarachnoid hemorrhage.  Otherwise, she has been healthy.   PAST SURGICAL HISTORY:  The patient had a tubal ligation four years ago.   ALLERGIES:  No known drug allergies.   CURRENT MEDICATIONS:  None.   MARITAL STATUS:  The patient is married. She has two children. She lives in  [place] with her husband. She continues to smoke one per day and has done  so for 20+ years. She has two glasses of wine three or four evenings per  week. 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   FAMILY HISTORY:  The patient's mother is age 85, alive, and well. The  patient's father is 75, alive, and well. She is an only child.   REVIEW OF SYSTEMS:  Completely negative except for the above-noted pain  intermittently behind the right  eye times two weeks which she describes as  mild and the episode of right arm numbness which occurred approximately one  month and lasted less than a minute.   LABORATORY DATA:  Pending.   PHYSICAL EXAMINATION:  GENERAL: A very pleasant 45 year old white female in  no acute distress.  VITAL SIGNS: Blood pressure 119/86, pulse 98, respirations 16, temperature  98.4. Oxygen saturation 97% on room air. Weight 125 pounds.  HEENT: Unremarkable.  NECK: No bruits. No jugular venous distention.  HEART: Regular rate and rhythm without murmur.  LUNGS: Clear.  ABDOMEN: Soft and nontender.  EXTREMITIES: Pulses are weak but intact. There is no significant edema.  SKIN: Warm and dry.  NEUROLOGIC: The patient is alert and oriented and follows commands. Cranial  nerves II-XII are grossly intact. Sensation is intact to light touch. Motor  strength is 5/5 throughout.   The airway is rated at a  3.   ASA scale is 2.   IMPRESSION:  1.  History of grade 1 subarachnoid hemorrhage in September 2002.  2.  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  3.  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  4.  Continued tobacco use.  5.  History of tubal ligation.  6.  Recent mild headaches behind the right eye.   PLAN:  The patient will undergo cerebral angiogram today to further evaluate  her cerebrovascular disease to be performed by Dr. Corliss Skains.      Markus.Osmond   DR/MEDQ  D:  04/09/2005  T:  04/09/2005  Job:  [id number]   cc:   Payton Doughty, M.D.  [address]  [place]  [place] [address]  Fax: [phone]

## 2018-01-22 ENCOUNTER — Other Ambulatory Visit: Payer: Self-pay

## 2018-01-22 ENCOUNTER — Emergency Department (HOSPITAL_COMMUNITY)
Admission: EM | Admit: 2018-01-22 | Discharge: 2018-01-23 | Disposition: A | Discharge status: Home / Self Care | Payer: Self-pay | Attending: Emergency Medicine | Admitting: Emergency Medicine

## 2018-01-22 ENCOUNTER — Encounter (HOSPITAL_COMMUNITY): Payer: Self-pay | Admitting: *Deleted

## 2018-01-22 DIAGNOSIS — Y92009 Unspecified place in unspecified non-institutional (private) residence as the place of occurrence of the external cause: Secondary | ICD-10-CM | POA: Insufficient documentation

## 2018-01-22 DIAGNOSIS — F17200 Nicotine dependence, unspecified, uncomplicated: Secondary | ICD-10-CM | POA: Insufficient documentation

## 2018-01-22 DIAGNOSIS — S0101XA Laceration without foreign body of scalp, initial encounter: Secondary | ICD-10-CM | POA: Insufficient documentation

## 2018-01-22 DIAGNOSIS — Y9301 Activity, walking, marching and hiking: Secondary | ICD-10-CM | POA: Insufficient documentation

## 2018-01-22 DIAGNOSIS — Y999 Unspecified external cause status: Secondary | ICD-10-CM | POA: Insufficient documentation

## 2018-01-22 DIAGNOSIS — W19XXXA Unspecified fall, initial encounter: Secondary | ICD-10-CM

## 2018-01-22 DIAGNOSIS — W108XXA Fall (on) (from) other stairs and steps, initial encounter: Secondary | ICD-10-CM | POA: Insufficient documentation

## 2018-01-22 DIAGNOSIS — Z23 Encounter for immunization: Secondary | ICD-10-CM | POA: Insufficient documentation

## 2018-01-22 MED ORDER — LIDOCAINE-EPINEPHRINE (PF) 2 %-1:200000 IJ SOLN
20.0000 mL | Freq: Once | INTRAMUSCULAR | Status: AC
  Administered 2018-01-23: 20 mL
  Filled 2018-01-22: qty 20

## 2018-01-22 MED ORDER — TETANUS-DIPHTH-ACELL PERTUSSIS 5-2.5-18.5 LF-MCG/0.5 IM SUSP
0.5000 mL | Freq: Once | INTRAMUSCULAR | Status: AC
  Administered 2018-01-23: 0.5 mL via INTRAMUSCULAR
  Filled 2018-01-22: qty 0.5

## 2018-01-22 MED ORDER — SODIUM CHLORIDE 0.9 % IV BOLUS: 1000.0000 mL | Freq: Once | INTRAVENOUS | Status: DC

## 2018-01-22 NOTE — ED Triage Notes (Signed)
The pt arrived by gems from home  Where the pt lost her footing and fell down some steps inside the home.  No loc  shes alert  2" laceration lt forehead.  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

## 2018-01-23 ENCOUNTER — Emergency Department (HOSPITAL_COMMUNITY): Payer: Self-pay

## 2018-01-23 NOTE — Discharge Instructions (Addendum)
WOUND CARE °Please have your stitches/staples removed in 3-5 or sooner if you have concerns. You may do this at any available urgent care or at your primary care doctor's office. ° Keep area clean and dry for 24 hours. Do not remove °bandage, if applied. ° After 24 hours, remove bandage and wash wound °gently with mild soap and warm water. Reapply °a new bandage after cleaning wound, if directed. ° Continue daily cleansing with soap and water until °stitches/staples are removed. ° Do not apply any ointments or creams to the wound °while stitches/staples are in place, as this may cause °delayed healing. ° Seek medical careif you experience any of the following °signs of infection: Swelling, redness, pus drainage, °streaking, fever >101.0 F ° Seek care if you experience excessive bleeding °that does not stop after 15-20 minutes of constant, firm °pressure. ° ° ° °

## 2018-01-23 NOTE — ED Provider Notes (Signed)
[facility] EMERGENCY DEPARTMENT Provider Note   CSN: [id number] Arrival date & time: 01/22/18  2218     History   Chief Complaint Chief Complaint  Patient presents with  . Fall    HPI [name] [name] is a 52 y.o. female.  HPI 52 year old Caucasian female with past medical history significant for alcohol abuse and brain aneurysm presents to the emergency department today for evaluation of fall after alcohol intoxication.  The patient states that she has been drinking red wine all evening.  She states that she was going down her spiral staircase when she slipped halfway down on the step and fell.  States that she fell approximately 4-5 steps.  Patient reports hitting her head.  She denies any known LOC.  Patient's mother immediately came to patient's side.  Patient was ambulatory.  EMS was called and transported patient to the ED for evaluation.  Patient was placed in C-spine precautions.  Patient reports a significant amount of alcohol use this evening.  She denies any headache, vision changes, syncope, chest pain or shortness of breath prior to the accident.  Patient denies any other pain except for pain over the laceration of her head.  Denies any vision changes, neck pain, back pain.  Denies any associated paresthesias or weakness.  Unsure of last tetanus shot. History reviewed. No pertinent past medical history.  There are no active problems to display for this patient.   History reviewed. No pertinent surgical history.   OB History   None      Home Medications    Prior to Admission medications   Not on File    Family History No family history on file.  Social History Social History   Tobacco Use  . Smoking status: Current Every Day Smoker  . Smokeless tobacco: Never Used  Substance Use Topics  . Alcohol use: Yes  . Drug use: Not on file     Allergies   Patient has no known allergies.   Review of Systems Review of Systems  All other  systems reviewed and are negative.    Physical Exam Updated Vital Signs BP 134/80   Pulse 88   Resp 18   Ht 5\' 2"  (1.575 m)   Wt 56.7 kg (125 lb)   SpO2 97%   BMI 22.86 kg/m   Physical Exam Physical Exam  Constitutional: Pt is oriented to person, place, and time.  Patient smells of alcohol.  Appears well-developed and well-nourished. No distress.  HENT:  Head: Normocephalic.  Patient has 6.5 cm laceration to the left upper forehead.  Donnetta Hutching is intact.  Approximately 5 mm deep.  No significant debris noted.  Bleeding is controlled at this time.  No skull depression noted. Ears: No bilateral hemotympanum. Nose: Nose normal. No septal hematoma. Mouth/Throat: Uvula is midline, oropharynx is clear and moist and mucous membranes are normal.  Eyes: Conjunctivae and EOM are normal. Pupils are equal, round, and reactive to light.  Neck: No spinous process tenderness and no muscular tenderness present. No rigidity. Normal range of motion present.    Patient in C-spine precautions. No midline cervical tenderness No crepitus, deformity or step-offs  Cardiovascular: Normal rate, regular rhythm and intact distal pulses.   Pulses:      Radial pulses are 2+ on the right side, and 2+ on the left side.       Dorsalis pedis pulses are 2+ on the right side, and 2+ on the left side.  Posterior tibial pulses are 2+ on the right side, and 2+ on the left side.  Pulmonary/Chest: Effort normal and breath sounds normal. No accessory muscle usage. No respiratory distress. No decreased breath sounds. No wheezes. No rhonchi. No rales. Exhibits no tenderness and no bony tenderness.  No flail segment, crepitus or deformity Equal chest expansion  Abdominal: Soft. Normal appearance and bowel sounds are normal. There is no tenderness. There is no rigidity, no guarding and no CVA tenderness.  Abd soft and nontender  Musculoskeletal: Normal range of motion.       Thoracic back: Exhibits normal range of  motion.       Lumbar back: Exhibits normal range of motion.  Full range of motion of the T-spine and L-spine No tenderness to palpation of the spinous processes of the T-spine or L-spine No crepitus, deformity or step-offs No tenderness to palpation of the paraspinous muscles of the L-spine  Lymphadenopathy:    Pt has no cervical adenopathy.  Neurological: Pt is alert and oriented to person, place, and time. Normal reflexes. No cranial nerve deficit. GCS eye subscore is 4. GCS verbal subscore is 5. GCS motor subscore is 6.  Reflex Scores:      Bicep reflexes are 2+ on the right side and 2+ on the left side.      Brachioradialis reflexes are 2+ on the right side and 2+ on the left side.      Patellar reflexes are 2+ on the right side and 2+ on the left side.      Achilles reflexes are 2+ on the right side and 2+ on the left side. Speech is clear and goal oriented, follows commands Normal 5/5 strength in upper and lower extremities bilaterally including dorsiflexion and plantar flexion, strong and equal grip strength Sensation normal to light and sharp touch Moves extremities without ataxia, coordination intact Normal gait and balance No Clonus  Skin: Skin is warm and dry. No rash noted. Pt is not diaphoretic. No erythema.  Psychiatric: Normal mood and affect.  Nursing note and vitals reviewed.     ED Treatments / Results  Labs (all labs ordered are listed, but only abnormal results are displayed) Labs Reviewed - No data to display  EKG None  Radiology Ct Head Wo Contrast  Result Date: 01/23/2018 CLINICAL DATA:  Fall EXAM: CT HEAD WITHOUT CONTRAST CT MAXILLOFACIAL WITHOUT CONTRAST CT CERVICAL SPINE WITHOUT CONTRAST TECHNIQUE: Multidetector CT imaging of the head, cervical spine, and maxillofacial structures were performed using the standard protocol without intravenous contrast. Multiplanar CT image reconstructions of the cervical spine and maxillofacial structures were also  generated. COMPARISON:  None. FINDINGS: CT HEAD FINDINGS Brain: No mass lesion, intraparenchymal hemorrhage or extra-axial collection. No evidence of acute cortical infarct. Right frontal subcortical and deep white matter hypoattenuation, possibly related to remote ischemia. 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 Skull: Large left frontal scalp laceration measuring up to 3.5 cm. No skull fracture. CT MAXILLOFACIAL FINDINGS Osseous: --Complex facial fracture types: No LeFort, zygomaticomaxillary complex or nasoorbitoethmoidal fracture. --Simple fracture types: None. --Mandible: No fracture or dislocation. Severe dental disease, worst in the most posterior remaining left maxillary tooth, which has a large carie and large periapical lucency. Orbits: The globes are intact. Normal appearance of the intra- and extraconal fat. Symmetric extraocular muscles and optic nerves. Sinuses: No fluid levels or advanced mucosal thickening. Soft tissues: Normal visualized extracranial soft tissues. CT CERVICAL SPINE FINDINGS Alignment: No static subluxation. Facets  are aligned. Occipital condyles and the lateral masses of C1-C2 are aligned. Skull base and vertebrae: No acute fracture. Soft tissues and spinal canal: No prevertebral fluid or swelling. No visible canal hematoma. Disc levels: No advanced spinal canal or neural foraminal stenosis. Upper chest: No pneumothorax, pulmonary nodule or pleural effusion. Other: Normal visualized paraspinal cervical soft tissues. IMPRESSION: 1. No acute intracranial abnormality. 2. Large left frontal scalp laceration. No skull fracture or maxillofacial fracture. 3. Severe dental disease of the most posterior remaining left maxillary tooth. 4. No acute fracture or static subluxation of the cervical spine. 5. Embolization coil mass in the expected location of the right posterior communicating artery. Electronically Signed   By: Deatra Robinson M.D.   On: 01/23/2018 01:38   Ct Cervical Spine Wo Contrast  Result Date: 01/23/2018 CLINICAL DATA:  Fall EXAM: CT HEAD WITHOUT CONTRAST CT MAXILLOFACIAL WITHOUT CONTRAST CT CERVICAL SPINE WITHOUT CONTRAST TECHNIQUE: Multidetector CT imaging of the head, cervical spine, and maxillofacial structures were performed using the standard protocol without intravenous contrast. Multiplanar CT image reconstructions of the cervical spine and maxillofacial structures were also generated. COMPARISON:  None. FINDINGS: CT HEAD FINDINGS Brain: No mass lesion, intraparenchymal hemorrhage or extra-axial collection. No evidence of acute cortical infarct. Right frontal subcortical and deep white matter hypoattenuation, possibly related to remote ischemia. 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 Skull: Large left frontal scalp laceration measuring up to 3.5 cm. No skull fracture. CT MAXILLOFACIAL FINDINGS Osseous: --Complex facial fracture types: No LeFort, zygomaticomaxillary complex or nasoorbitoethmoidal fracture. --Simple fracture types: None. --Mandible: No fracture or dislocation. Severe dental disease, worst in the most posterior remaining left maxillary tooth, which has a large carie and large periapical lucency. Orbits: The globes are intact. Normal appearance of the intra- and extraconal fat. Symmetric extraocular muscles and optic nerves. Sinuses: No fluid levels or advanced mucosal thickening. Soft tissues: Normal visualized extracranial soft tissues. CT CERVICAL SPINE FINDINGS Alignment: No static subluxation. Facets are aligned. Occipital condyles and the lateral masses of C1-C2 are aligned. Skull base and vertebrae: No acute fracture. Soft tissues and spinal canal: No prevertebral fluid or swelling. No visible canal hematoma. Disc levels: No advanced spinal canal or neural foraminal stenosis. Upper chest: No pneumothorax, pulmonary nodule or pleural  effusion. Other: Normal visualized paraspinal cervical soft tissues. IMPRESSION: 1. No acute intracranial abnormality. 2. Large left frontal scalp laceration. No skull fracture or maxillofacial fracture. 3. Severe dental disease of the most posterior remaining left maxillary tooth. 4. No acute fracture or static subluxation of the cervical spine. 5. Embolization coil mass in the expected location of the right posterior communicating artery. Electronically Signed   By: Deatra Robinson M.D.   On: 01/23/2018 01:38   Ct Maxillofacial Wo Contrast  Result Date: 01/23/2018 CLINICAL DATA:  Fall EXAM: CT HEAD WITHOUT CONTRAST CT MAXILLOFACIAL WITHOUT CONTRAST CT CERVICAL SPINE WITHOUT CONTRAST TECHNIQUE: Multidetector CT imaging of the head, cervical spine, and maxillofacial structures were performed using the standard protocol without intravenous contrast. Multiplanar CT image reconstructions of the cervical spine and maxillofacial structures were also generated. COMPARISON:  None. FINDINGS: CT HEAD FINDINGS Brain: No mass lesion, intraparenchymal hemorrhage or extra-axial collection. No evidence of acute cortical infarct. Right frontal subcortical and deep white matter hypoattenuation, possibly related to remote ischemia. 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 Skull: Large left frontal scalp laceration measuring up to 3.5 cm. No skull fracture. CT  MAXILLOFACIAL FINDINGS Osseous: --Complex facial fracture types: No LeFort, zygomaticomaxillary complex or nasoorbitoethmoidal fracture. --Simple fracture types: None. --Mandible: No fracture or dislocation. Severe dental disease, worst in the most posterior remaining left maxillary tooth, which has a large carie and large periapical lucency. Orbits: The globes are intact. Normal appearance of the intra- and extraconal fat. Symmetric extraocular muscles and optic nerves. Sinuses: No fluid levels or advanced mucosal  thickening. Soft tissues: Normal visualized extracranial soft tissues. CT CERVICAL SPINE FINDINGS Alignment: No static subluxation. Facets are aligned. Occipital condyles and the lateral masses of C1-C2 are aligned. Skull base and vertebrae: No acute fracture. Soft tissues and spinal canal: No prevertebral fluid or swelling. No visible canal hematoma. Disc levels: No advanced spinal canal or neural foraminal stenosis. Upper chest: No pneumothorax, pulmonary nodule or pleural effusion. Other: Normal visualized paraspinal cervical soft tissues. IMPRESSION: 1. No acute intracranial abnormality. 2. Large left frontal scalp laceration. No skull fracture or maxillofacial fracture. 3. Severe dental disease of the most posterior remaining left maxillary tooth. 4. No acute fracture or static subluxation of the cervical spine. 5. Embolization coil mass in the expected location of the right posterior communicating artery. Electronically Signed   By: Deatra Robinson M.D.   On: 01/23/2018 01:38    Procedures .Marland KitchenLaceration Repair Date/Time: 01/23/2018 7:35 AM Performed by: Rise Mu, PA-C Authorized by: Rise Mu, PA-C   Consent:    Consent obtained:  Verbal   Consent given by:  Patient   Risks discussed:  Infection, need for additional repair, nerve damage, poor wound healing, poor cosmetic result, pain, retained foreign body, tendon damage and vascular damage   Alternatives discussed:  No treatment Anesthesia (see MAR for exact dosages):    Anesthesia method:  Local infiltration   Local anesthetic:  Lidocaine 1% WITH epi Laceration details:    Location:  Scalp   Scalp location:  Frontal   Length (cm):  6.5   Depth (mm):  5 Repair type:    Repair type:  Intermediate Pre-procedure details:    Preparation:  Patient was prepped and draped in usual sterile fashion and imaging obtained to evaluate for foreign bodies Exploration:    Hemostasis achieved with:  Direct pressure and epinephrine    Wound exploration: wound explored through full range of motion and entire depth of wound probed and visualized     Wound extent: no foreign bodies/material noted and no underlying fracture noted     Contaminated: no   Treatment:    Area cleansed with:  Betadine and saline   Amount of cleaning:  Standard   Irrigation solution:  Sterile saline   Irrigation volume:  60   Irrigation method:  Pressure wash   Visualized foreign bodies/material removed: no   Subcutaneous repair:    Suture size:  5-0   Suture material:  Vicryl   Suture technique:  Simple interrupted   Number of sutures:  3 Skin repair:    Repair method:  Sutures   Suture size:  5-0   Suture material:  Prolene   Number of sutures:  12 Approximation:    Approximation:  Close Post-procedure details:    Dressing:  Antibiotic ointment, non-adherent dressing and bulky dressing   Patient tolerance of procedure:  Tolerated well, no immediate complications   (including critical care time)  Medications Ordered in ED Medications  Tdap (BOOSTRIX) injection 0.5 mL (0.5 mLs Intramuscular Given 01/23/18 0117)  lidocaine-EPINEPHrine (XYLOCAINE W/EPI) 2 %-1:200000 (PF) injection 20 mL (20 mLs Infiltration  Given 01/23/18 0116)     Initial Impression / Assessment and Plan / ED Course  I have reviewed the triage vital signs and the nursing notes.  Pertinent labs & imaging results that were available during my care of the patient were reviewed by me and considered in my medical decision making (see chart for details).     Patient presents to the emergency department today for laceration to the left forehead after mechanical fall due to alcohol intoxication.   Patient overall well-appearing and nontoxic.  Vital signs are reassuring.  Tetanus updated in the ED.  Imaging was obtained of head, cervical spine and maxillofacial.  This showed no acute abnormalities.  Patient does have history of aneurysm however she had no preceding symptoms  prior to her fall.  She denies any headache or vision changes.    No signs of intrathoracic or intra-abdominal trauma.  Patient has been ambulatory since the event.  She has no focal neuro deficit on my examination.  Patient appears clinically sober.  Her mother is at bedside who will take patient home.  Wound was repaired as above.  Tolerating p.o. fluids.  Discussed wound care at home.  Pt is hemodynamically stable, in NAD, & able to ambulate in the ED. Evaluation does not show pathology that would require ongoing emergent intervention or inpatient treatment. I explained the diagnosis to the patient. Pain has been managed & has no complaints prior to dc. Pt is comfortable with above plan and is stable for discharge at this time. All questions were answered prior to disposition. Strict return precautions for f/u to the ED were discussed. Encouraged follow up with PCP.   Final Clinical Impressions(s) / ED Diagnoses   Final diagnoses:  Fall, initial encounter  Laceration of scalp, initial encounter    ED Discharge Orders    None       Wallace Keller 01/23/18 1610    Tegeler, Canary Brim, MD 01/23/18 1229

## 2018-01-23 NOTE — ED Notes (Signed)
To ct

## 2018-01-23 NOTE — ED Notes (Signed)
Clothes were toen paper shirt given

## 2018-01-23 NOTE — ED Notes (Signed)
Pt returned from ct

## 2024-10-12 ENCOUNTER — Observation Stay (HOSPITAL_COMMUNITY): Payer: MEDICAID

## 2024-10-12 ENCOUNTER — Emergency Department (HOSPITAL_COMMUNITY): Payer: MEDICAID

## 2024-10-12 ENCOUNTER — Other Ambulatory Visit: Payer: Self-pay

## 2024-10-12 ENCOUNTER — Encounter (HOSPITAL_COMMUNITY): Payer: Self-pay

## 2024-10-12 ENCOUNTER — Observation Stay (HOSPITAL_COMMUNITY)
Admission: EM | Admit: 2024-10-12 | Discharge: 2024-10-14 | Disposition: A | Discharge status: Home / Self Care | Payer: MEDICAID | Attending: Family Medicine | Admitting: Family Medicine

## 2024-10-12 DIAGNOSIS — G459 Transient cerebral ischemic attack, unspecified: Principal | ICD-10-CM | POA: Insufficient documentation

## 2024-10-12 DIAGNOSIS — R519 Headache, unspecified: Secondary | ICD-10-CM | POA: Diagnosis present

## 2024-10-12 DIAGNOSIS — E785 Hyperlipidemia, unspecified: Secondary | ICD-10-CM | POA: Diagnosis not present

## 2024-10-12 DIAGNOSIS — Z8673 Personal history of transient ischemic attack (TIA), and cerebral infarction without residual deficits: Secondary | ICD-10-CM | POA: Diagnosis not present

## 2024-10-12 DIAGNOSIS — Z8679 Personal history of other diseases of the circulatory system: Secondary | ICD-10-CM | POA: Diagnosis not present

## 2024-10-12 DIAGNOSIS — I161 Hypertensive emergency: Secondary | ICD-10-CM | POA: Diagnosis not present

## 2024-10-12 DIAGNOSIS — I639 Cerebral infarction, unspecified: Principal | ICD-10-CM

## 2024-10-12 DIAGNOSIS — F17210 Nicotine dependence, cigarettes, uncomplicated: Secondary | ICD-10-CM | POA: Insufficient documentation

## 2024-10-12 DIAGNOSIS — I6389 Other cerebral infarction: Principal | ICD-10-CM | POA: Insufficient documentation

## 2024-10-12 LAB — CBC
HCT: 47.7 % — ABNORMAL HIGH (ref 36.0–46.0)
Hemoglobin: 16.1 g/dL — ABNORMAL HIGH (ref 12.0–15.0)
MCH: 29.1 pg (ref 26.0–34.0)
MCHC: 33.8 g/dL (ref 30.0–36.0)
MCV: 86.1 fL (ref 80.0–100.0)
Platelets: 252 K/uL (ref 150–400)
RBC: 5.54 MIL/uL — ABNORMAL HIGH (ref 3.87–5.11)
RDW: 14.6 % (ref 11.5–15.5)
WBC: 9.2 K/uL (ref 4.0–10.5)
nRBC: 0 % (ref 0.0–0.2)

## 2024-10-12 LAB — I-STAT CHEM 8, ED
BUN: 12 mg/dL (ref 6–20)
Calcium, Ion: 1.14 mmol/L — ABNORMAL LOW (ref 1.15–1.40)
Chloride: 104 mmol/L (ref 98–111)
Creatinine, Ser: 0.9 mg/dL (ref 0.44–1.00)
Glucose, Bld: 110 mg/dL — ABNORMAL HIGH (ref 70–99)
HCT: 46 % (ref 36.0–46.0)
Hemoglobin: 15.6 g/dL — ABNORMAL HIGH (ref 12.0–15.0)
Potassium: 4.2 mmol/L (ref 3.5–5.1)
Sodium: 141 mmol/L (ref 135–145)
TCO2: 26 mmol/L (ref 22–32)

## 2024-10-12 LAB — COMPREHENSIVE METABOLIC PANEL WITH GFR
ALT: 12 U/L (ref 0–44)
AST: 25 U/L (ref 15–41)
Albumin: 4.4 g/dL (ref 3.5–5.0)
Alkaline Phosphatase: 132 U/L — ABNORMAL HIGH (ref 38–126)
Anion gap: 13 (ref 5–15)
BUN: 11 mg/dL (ref 6–20)
CO2: 25 mmol/L (ref 22–32)
Calcium: 10.1 mg/dL (ref 8.9–10.3)
Chloride: 102 mmol/L (ref 98–111)
Creatinine, Ser: 0.79 mg/dL (ref 0.44–1.00)
GFR, Estimated: >60 mL/min
Glucose, Bld: 104 mg/dL — ABNORMAL HIGH (ref 70–99)
Potassium: 4.1 mmol/L (ref 3.5–5.1)
Sodium: 139 mmol/L (ref 135–145)
Total Bilirubin: 0.5 mg/dL (ref 0.0–1.2)
Total Protein: 8.1 g/dL (ref 6.5–8.1)

## 2024-10-12 LAB — DIFFERENTIAL
Abs Immature Granulocytes: 0.03 K/uL (ref 0.00–0.07)
Basophils Absolute: 0.1 K/uL (ref 0.0–0.1)
Basophils Relative: 1 %
Eosinophils Absolute: 0.1 K/uL (ref 0.0–0.5)
Eosinophils Relative: 1 %
Immature Granulocytes: 0 %
Lymphocytes Relative: 27 %
Lymphs Abs: 2.5 K/uL (ref 0.7–4.0)
Monocytes Absolute: 0.4 K/uL (ref 0.1–1.0)
Monocytes Relative: 5 %
Neutro Abs: 6.1 K/uL (ref 1.7–7.7)
Neutrophils Relative %: 66 %

## 2024-10-12 LAB — CBG MONITORING, ED: Glucose-Capillary: 102 mg/dL — ABNORMAL HIGH (ref 70–99)

## 2024-10-12 LAB — APTT: aPTT: 32 s (ref 24–36)

## 2024-10-12 LAB — PROTIME-INR
INR: 1 (ref 0.8–1.2)
Prothrombin Time: 13.4 s (ref 11.4–15.2)

## 2024-10-12 LAB — ETHANOL: Alcohol, Ethyl (B): <15 mg/dL

## 2024-10-12 MED ORDER — ACETAMINOPHEN 160 MG/5ML PO SOLN: 650.0000 mg | ORAL | Status: DC | PRN

## 2024-10-12 MED ORDER — PROCHLORPERAZINE EDISYLATE 10 MG/2ML IJ SOLN
10.0000 mg | Freq: Once | INTRAMUSCULAR | Status: AC
  Administered 2024-10-12: 10 mg via INTRAVENOUS
  Filled 2024-10-12: qty 2

## 2024-10-12 MED ORDER — LORAZEPAM 1 MG PO TABS
1.0000 mg | ORAL_TABLET | Freq: Once | ORAL | Status: AC | PRN
  Administered 2024-10-12: 1 mg via ORAL
  Filled 2024-10-12: qty 1

## 2024-10-12 MED ORDER — ONDANSETRON HCL 4 MG/2ML IJ SOLN: 4.0000 mg | Freq: Four times a day (QID) | INTRAMUSCULAR | Status: DC | PRN

## 2024-10-12 MED ORDER — ACETAMINOPHEN 325 MG PO TABS: 650.0000 mg | ORAL_TABLET | ORAL | Status: DC | PRN

## 2024-10-12 MED ORDER — LABETALOL HCL 5 MG/ML IV SOLN: 10.0000 mg | INTRAVENOUS | Status: DC | PRN

## 2024-10-12 MED ORDER — STROKE: EARLY STAGES OF RECOVERY BOOK
Freq: Once | Status: AC
  Filled 2024-10-12: qty 1

## 2024-10-12 MED ORDER — SENNOSIDES-DOCUSATE SODIUM 8.6-50 MG PO TABS: 1.0000 | ORAL_TABLET | Freq: Every evening | ORAL | Status: DC | PRN

## 2024-10-12 MED ORDER — IOHEXOL 350 MG/ML SOLN
75.0000 mL | Freq: Once | INTRAVENOUS | Status: AC | PRN
  Administered 2024-10-12: 75 mL via INTRAVENOUS

## 2024-10-12 MED ORDER — SODIUM CHLORIDE 0.9 % IV SOLN: INTRAVENOUS | Status: DC

## 2024-10-12 MED ORDER — ACETAMINOPHEN 500 MG PO TABS
1000.0000 mg | ORAL_TABLET | Freq: Once | ORAL | Status: AC
  Administered 2024-10-12: 1000 mg via ORAL
  Filled 2024-10-12: qty 2

## 2024-10-12 MED ORDER — ACETAMINOPHEN 650 MG RE SUPP: 650.0000 mg | RECTAL | Status: DC | PRN

## 2024-10-12 MED ORDER — LACTATED RINGERS IV BOLUS
1000.0000 mL | Freq: Once | INTRAVENOUS | Status: AC
  Administered 2024-10-12: 1000 mL via INTRAVENOUS

## 2024-10-12 NOTE — ED Notes (Signed)
 Patient transported to MRI

## 2024-10-12 NOTE — ED Triage Notes (Signed)
 Patient reports that last night she was having issues getting words out with a left sided facial droop. She states that she has stabbing pain in her head today. She does not have a facial droop today and has not been having any issues with speaking today. She states she did not come last night because symptoms resolved quickly.   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 She reports the left side coil worked and the right side did not.

## 2024-10-12 NOTE — ED Notes (Signed)
 EDP notified of high BP's

## 2024-10-12 NOTE — H&P (Signed)
 " History and Physical    [name] [name] FMW:[id number] DOB: [birth year] DOA: 10/12/2024  PCP: Patient, No Pcp Per   Patient coming from: Home   Chief Complaint:  Chief Complaint  Patient presents with   Headache   ED TRIAGE note:  Patient reports that last night she was having issues getting words out with a left sided facial droop. She states that she has stabbing pain in her head today. She does not have a facial droop today and has not been having any issues with speaking today. She states she did not come last night because symptoms resolved quickly.    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 She reports the left side coil worked and the right side did not.            HPI:  [name] [name] is a 59 y.o. female with medical history significant of intracranial aneurysm with prior subarachnoid hemorrhage requiring 2 previous coiling presented to  emergency department complaining of multiple issues include unable to getting words out from her mouth, right sided-sided facial droop, left-sided headache stabbing in quality yesterday.  Patient reported that today she does not have any facial droop anymore and does not have any issues with speaking at all however continues to have persistent headache.  Per patient she has history of ruptured aneurysm in the brain 22 hours ago status post coiling.   Patient denies any weakness, numbness, tingling, syncope, presyncope, headache, blurry vision.  Denies any chest pain, palpitation shortness of breath.   ED Course:  At presentation to ED patient is hypertensive blood pressure 226/133 which has been improved to 175/104 otherwise hemodynamically stable.  Lab work, CBC unremarkable except elevated hemoglobin level 16.1.  CMP unremarkable except elevated ALP 132.  Normal blood alcohol level.  Normal pro time INR.  CT head without contrast finding  following: 1. No acute intracranial abnormality. 2. Subacute to chronic appearing infarct in the high left parietal lobe. Consider MRI for further evaluation. 3. 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 4. Moderate chronic microvascular ischemic change. 5. Coiled right posterior communicating artery aneurysm. 6. Partially visualized stent in the left ICA.  MRI brain without contrast: 1. Left parietal edema and mild expansion without correlate restricted diffusion. While this most likely represents evolving subacute to chronic infarct, the complete absence of restricted diffusion is somewhat atypical and recommend post-contrast imaging to help exclude malignancy. Also, areas of susceptibility artifact in this region may represent petechial hemorrhage, but recommend CTA head to help exclude underlying vascular malformation. 2. Right frontal lobe gliosis and moderate chronic microvascular ischemic change. 3. Coiled right posterior communicating artery aneurysm.  CTA head and neck results is pending.  ED physician consulted and discussed case with on-call neurology Dr. Michaela who reviewed the MRI and stated that given patient has multiple petechial hemorrhage with concern for acute CVA patient might benefit from long-term anticoagulation.  Neurology will evaluate soon for further medical recommendation.  Hospitalist consulted for further management and evaluation for acute versus subacute stroke, stroke workup and hypertensive emergency.   Significant labs in the ED: Lab Orders         Protime-INR         APTT         CBC         Differential  Comprehensive metabolic panel         Ethanol         HIV Antibody (routine testing w rflx)         Lipid panel         CBC         Comprehensive metabolic panel         Hemoglobin A1c         I-stat chem 8, ED         CBG monitoring, ED       Review of Systems:  Review of Systems  Constitutional:  Negative for chills,  fever, malaise/fatigue and weight loss.  HENT:  Negative for hearing loss and tinnitus.   Cardiovascular:  Negative for chest pain and palpitations.  Musculoskeletal:  Negative for back pain, myalgias and neck pain.  Neurological:  Positive for headaches. Negative for dizziness, tingling, tremors, sensory change, speech change, focal weakness, seizures, loss of consciousness and weakness.  Psychiatric/Behavioral:  The patient is not nervous/anxious.     History reviewed. No pertinent past medical history.  History reviewed. No pertinent surgical history.   reports that she has been smoking. She has never used smokeless tobacco. She reports current alcohol use. No history on file for drug use.  Allergies[1]  History reviewed. No pertinent family history.  Prior to Admission medications  Not on File     Physical Exam: Vitals:   10/12/24 2245 10/12/24 2247 10/12/24 2300 10/13/24 0204  BP: (!) 137/91  (!) 142/88 (!) 158/85  Pulse: 66  68 76  Resp: (!) 21  20 17   Temp:  98.3 F (36.8 C)  98 F (36.7 C)  TempSrc:  Oral  Oral  SpO2: 96%  95% 100%  Weight:      Height:        Physical Exam Vitals and nursing note reviewed.  Constitutional:      General: She is not in acute distress.    Appearance: She is well-developed. She is not ill-appearing.  Eyes:     General: No visual field deficit.    Pupils: Pupils are equal, round, and reactive to light.  Cardiovascular:     Rate and Rhythm: Normal rate and regular rhythm.  Pulmonary:     Effort: Pulmonary effort is normal.     Breath sounds: Normal breath sounds.  Abdominal:     General: Bowel sounds are normal.     Palpations: Abdomen is soft.  Musculoskeletal:     Cervical back: Normal range of motion and neck supple.  Skin:    Capillary Refill: Capillary refill takes less than 2 seconds.  Neurological:     Mental Status: She is alert.     Cranial Nerves: No cranial nerve deficit, dysarthria or facial asymmetry.      Sensory: No sensory deficit.     Motor: No weakness.     Coordination: Coordination normal.     Gait: Gait normal.     Deep Tendon Reflexes: Reflexes normal.  Psychiatric:        Mood and Affect: Mood is anxious. Mood is not depressed.        Speech: Speech normal.        Cognition and Memory: Cognition is not impaired. Memory is impaired.      Labs on Admission: I have personally reviewed following labs and imaging studies  CBC: Recent Labs  Lab 10/12/24 1351 10/12/24 1408  WBC 9.2  --   NEUTROABS 6.1  --  HGB 16.1* 15.6*  HCT 47.7* 46.0  MCV 86.1  --   PLT 252  --    Basic Metabolic Panel: Recent Labs  Lab 10/12/24 1351 10/12/24 1408  NA 139 141  K 4.1 4.2  CL 102 104  CO2 25  --   GLUCOSE 104* 110*  BUN 11 12  CREATININE 0.79 0.90  CALCIUM  10.1  --    GFR: Estimated Creatinine Clearance: 50.8 mL/min (by C-G formula based on SCr of 0.9 mg/dL). Liver Function Tests: Recent Labs  Lab 10/12/24 1351  AST 25  ALT 12  ALKPHOS 132*  BILITOT 0.5  PROT 8.1  ALBUMIN 4.4   No results for input(s): LIPASE, AMYLASE in the last 168 hours. No results for input(s): AMMONIA in the last 168 hours. Coagulation Profile: Recent Labs  Lab 10/12/24 1351  INR 1.0   Cardiac Enzymes: No results for input(s): CKTOTAL, CKMB, CKMBINDEX, TROPONINI, TROPONINIHS in the last 168 hours. BNP (last 3 results) No results for input(s): BNP in the last 8760 hours. HbA1C: Recent Labs    10/12/24 1351  HGBA1C 5.5   CBG: Recent Labs  Lab 10/12/24 1602  GLUCAP 102*   Lipid Profile: No results for input(s): CHOL, HDL, LDLCALC, TRIG, CHOLHDL, LDLDIRECT in the last 72 hours. Thyroid Function Tests: No results for input(s): TSH, T4TOTAL, FREET4, T3FREE, THYROIDAB in the last 72 hours. Anemia Panel: No results for input(s): VITAMINB12, FOLATE, FERRITIN, TIBC, IRON, RETICCTPCT in the last 72 hours. Urine analysis: No results  found for: COLORURINE, APPEARANCEUR, LABSPEC, PHURINE, GLUCOSEU, HGBUR, BILIRUBINUR, KETONESUR, PROTEINUR, UROBILINOGEN, NITRITE, LEUKOCYTESUR  Radiological Exams on Admission: I have personally reviewed images MR BRAIN W CONTRAST Result Date: 10/13/2024 EXAM: MRI BRAIN WITH CONTRAST 10/13/2024 02:36:00 AM TECHNIQUE: Multiplanar multisequence MRI of the head/brain was performed with the administration of intravenous contrast. CONTRAST: 5 mL Gadavist  COMPARISON: None available. CLINICAL HISTORY: Vasculitis suspected, CNS FINDINGS: Post-contrast imaging performed to further evaluate abnormality seen on MRI head from yesterday. No abnormal enhancement identified. Dural venous sinuses are patent. IMPRESSION: 1. No abnormal enhancement. Electronically signed by: Glendia Molt MD 10/13/2024 02:50 AM EST RP Workstation: [id number]   CT ANGIO HEAD NECK W WO CM Result Date: 10/12/2024 EXAM: CTA HEAD AND NECK WITH AND WITHOUT 10/12/2024 09:54:42 PM TECHNIQUE: CTA of the head and neck was performed with and without the administration of intravenous contrast. Multiplanar 2D and/or 3D reformatted images are provided for review. Automated exposure control, iterative reconstruction, and/or weight based adjustment of the mA/kV was utilized to reduce the radiation dose to as low as reasonably achievable. Stenosis of the internal carotid arteries measured using NASCET criteria. COMPARISON: None available CLINICAL HISTORY: Stroke, follow up FINDINGS: AORTIC ARCH AND ARCH VESSELS: Limited evaluation due to streak artifact from old venous contrast. Great vessel origins are patent. CERVICAL CAROTID ARTERIES: No dissection, arterial injury, or hemodynamically significant stenosis by NASCET criteria. Left greater than right carotid bifurcation atherosclerosis without greater than 50% stenosis. CERVICAL VERTEBRAL ARTERIES: Limited evaluation of the proximal right vertebral artery due to streak artifact. Within  this limitation, no dissection, arterial injury, or significant stenosis. LUNGS AND MEDIASTINUM: Unremarkable. SOFT TISSUES: No acute abnormality. BONES: No acute abnormality. ANTERIOR CIRCULATION: No significant stenosis of the internal carotid arteries. Apparent stents at the carotid termini bilaterally. Coiled right posterior communicating artery aneurysm with approximately x 3 mm focus of residual opacification/contrast in the neck of the aneurysm (for example, see series 5 image 96)next. No significant stenosis of the anterior cerebral arteries. No significant  stenosis of the middle cerebral arteries. POSTERIOR CIRCULATION: Limited evaluation of the proximal right posterior cerebral artery due to streak artifact from adjacent coiled mass. Within this limitation, no significant stenosis of the posterior cerebral arteries. No significant stenosis of the basilar artery. No significant stenosis of the vertebral arteries. No aneurysm. OTHER: No dural venous sinus thrombosis on this non-dedicated study. IMPRESSION: 1. No evidence of AVM in the left parietal abnormality seen on same day MRI. 2. No large vessel occlusion or hemodynamically significant stenosis. 3. Coiled right posterior communicating artery aneurysm with approximately 5 x 3 mm focus of residual opacification/contrast in the neck of the aneurysm. Electronically signed by: Glendia Molt MD 10/12/2024 10:39 PM EST RP Workstation: [id number]   MR BRAIN WO CONTRAST Result Date: 10/12/2024 EXAM: MRI Brain Without Contrast 10/12/2024 07:36:36 PM TECHNIQUE: Multiplanar multisequence MRI of the head/brain was performed without the administration of intravenous contrast. COMPARISON: None available. CLINICAL HISTORY: Stroke, follow up; Transient ischemic attack ([place]) FINDINGS: BRAIN AND VENTRICLES: No acute infarct. Left parietal edema without correlate restricted diffusion. Mild expansion of cortex in this area with many small foci of susceptibility artifact  some of which may be tubular. Right frontal lobe gliosis and moderate scattered T2 hyperintensities in the white matter compatible with chronic microvascular ischemic change. No acute intracranial hemorrhage. No midline shift. No hydrocephalus. The sella is unremarkable. Coiled right posterior communicating artery aneurysm. ORBITS: No significant abnormality. SINUSES AND MASTOIDS: No significant abnormality. BONES AND SOFT TISSUES: Normal marrow signal. No soft tissue abnormality. IMPRESSION: 1. Left parietal edema and mild expansion without correlate restricted diffusion. While this most likely represents evolving subacute to chronic infarct, the complete absence of restricted diffusion is somewhat atypical and recommend post-contrast imaging to help exclude malignancy. Also, areas of susceptibility artifact in this region may represent petechial hemorrhage, but recommend CTA head to help exclude underlying vascular malformation. 2. Right frontal lobe gliosis and moderate chronic microvascular ischemic change. 3. Coiled right posterior communicating artery aneurysm. Electronically signed by: Glendia Molt MD 10/12/2024 08:12 PM EST RP Workstation: [id number]   CT HEAD WO CONTRAST Result Date: 10/12/2024 EXAM: CT HEAD WITHOUT CONTRAST 10/12/2024 02:17:00 PM TECHNIQUE: CT of the head was performed without the administration of intravenous contrast. Automated exposure control, iterative reconstruction, and/or weight based adjustment of the mA/kV was utilized to reduce the radiation dose to as low as reasonably achievable. COMPARISON: 01/23/2018 CLINICAL HISTORY: headache, episode of left sided facial droop and trouble with word finding yesterday, hx of aneurysm headache, episode of left sided facial droop and trouble with word finding yesterday, hx of aneurysm headache, episode of left sided facial droop and trouble with word finding yesterday, hx of aneurysm FINDINGS: BRAIN AND VENTRICLES: No acute hemorrhage.  Moderate chronic microvascular ischemic change. 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 Subacute to chronic appearing infarct in the high left parietal lobe. Coils noted in the right parasellar region compatible with history of treated posterior communicating artery aneurysm. Streak artifact from the aneurysm coil slightly limits evaluation of the basilar cisterns and the inferior aspects of the cerebellar hemispheres. There is a partially visualized stent in the left ICA. No hydrocephalus. No extra-axial collection. No mass effect or midline shift. ORBITS: No acute abnormality. SINUSES: No acute abnormality. SOFT TISSUES AND SKULL: No acute soft tissue abnormality. No skull fracture. IMPRESSION: 1. No acute intracranial abnormality. 2. Subacute to chronic appearing infarct in the high left parietal lobe. Consider MRI for further evaluation. 3. 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  4. Moderate chronic microvascular ischemic change. 5. Coiled right posterior communicating artery aneurysm. 6. Partially visualized stent in the left ICA. Electronically signed by: Donnice Mania MD 10/12/2024 02:26 PM EST RP Workstation: [id number]     EKG: My personal interpretation of EKG shows: Pending EKG    Assessment/Plan: Principal Problem:   Acute CVA (cerebrovascular accident) [facility]) Active Problems:   History of intracranial aneurysm   History of subarachnoid hemorrhage   Hypertensive emergency    Assessment and Plan: Acute versus subacute CVA History of subarachnoid hemorrhage and intracranial aneurysm status post coiling -Presented to emergency department complaining of left-sided headache, right-sided facial droop (in the triage patient stated she has left-sided facial droop now stating she has right-sided facial droop) and dysarthria yesterday.  Today facial droop and dysarthria has been resolved however patient continues to have left-sided headache.  Denies any weakness, focal weakness, tingling,  numbness, syncope, presyncope, blurry vision and seizure. - At presentation to ED initially found hypertensive and eventually blood pressure improved without any intervention.  Otherwise hemodynamically stable. -CT head no acute intracranial process. -MRI brain represents petechial hemorrhage. -Please review CT head and MRI brain results for in-depth detail. - Pending CTA head and neck. - ED physician consulted and discussed case with neurology Dr. Michaela who reviewed the MRI and stated that given patient has multiple petechial hemorrhage with concern for acute CVA patient might benefit from long-term anticoagulation.  Neurology will evaluate soon for further medical recommendation. -Currently NIHSS stroke scale is 0. - Admitting patient for further stroke workup - Obtaining echocardiogram. - Continue neurocheck every 4 hours - Checking A1c lipid panel - Deferring management of antiplatelet and anticoagulation to neurology.  Will follow-up with recommendations. - Continue SCD and TED hose - Continue permissive hypertension for 24 hours, goal blood pressure below 210/120.  Continue IV labetalol  as needed. -Continue cardiac monitoring.  Obtaining EKG. -Will do stroke swallow screen followed by diet. - Consulted PT and OT. Addendum - Per Dr.Kirkpatrick concern for Mariesa vs amyloid related inflammation and as of now neurology recommended to treat as Aleah with prophylactic aspirin  81 mg daily, Aaylah workup and routine EEG. -As patient is not requiring oral anticoagulation and starting IV pharmacologic DVT prophylaxis in the hospital setting.   Hypertensive urgency -Elevated blood pressure 226/133 improved to 175/104.  Patient denies any history of hypertension in the past.  Concern for underlying long-term hypertension which has not been addressed and patient does not see any PCP on regular basis. -Continue permissive hypertension for next 24 hours.  Continue labetalol  as needed.  Based on blood  pressure trend need to initiate oral blood pressure regimen before discharge.   DVT prophylaxis:  SCDs, TED hose.  Adding IV Lovenox  for DVT prophylaxis. Code Status:  Full Code Diet: Heart healthy diet Family Communication:   Family was present at bedside, at the time of interview. Opportunity was given to ask question and all questions were answered satisfactorily.  Disposition Plan: Need to follow-up with CTA result and neurology recommendation for antiplatelet/antiplatelet therapy Consults: Neurology Admission status:   Observation, Telemetry bed  Severity of Illness: The appropriate patient status for this patient is OBSERVATION. Observation status is judged to be reasonable and necessary in order to provide the required intensity of service to ensure the patient's safety. The patient's presenting symptoms, physical exam findings, and initial radiographic and laboratory data in the context of their medical condition is felt to place them at decreased risk for further clinical deterioration. Furthermore, it is  anticipated that the patient will be medically stable for discharge from the hospital within 2 midnights of admission.     Brandey Vandalen, MD [facility]  How to contact the [facility] Attending or Consulting provider 7A - 7P or covering provider during after hours 7P -7A, for this patient.  Check the care team in [facility] and look for a) attending/consulting [facility] provider listed and b) the [facility] team listed Log into [email or web address] and use 's universal password to access. If you do not have the password, please contact the hospital operator. Locate the [facility] provider you are looking for under [facility] and page to a number that you can be directly reached. If you still have difficulty reaching the provider, please page the [facility] (Director on Call) for the Hospitalists listed on amion for assistance.  10/13/2024, 4:37 AM           [1] No Known Allergies  "

## 2024-10-12 NOTE — ED Provider Triage Note (Signed)
 Emergency Medicine Provider Triage Evaluation Note  [name] [name] , a 59 y.o. female  was evaluated in triage.  Pt complains of episode of facial droop and trouble with word finding for about 5 minutes yesterday. Hx of brain aneurysm with coils placed. Symptoms have since resolved. Denies headache, only mild dull ache now. Denies visual disturbances. Not on a blood thinner.   Review of Systems  Positive: mild dull headache, facial droop, trouble with word finding Negative: vision changes, extremity weakness  Physical Exam  BP (!) 188/100   Pulse (!) 105   Temp 97.8 F (36.6 C)   Resp 17   Ht 5' 1 (1.549 m)   Wt 56.7 kg   SpO2 98%   BMI 23.62 kg/m  Gen:   Awake, no distress   Resp:  Normal effort  MSK:   Moves extremities without difficulty  Other:  No focal neuro deficit in triage  Medical Decision Making  Medically screening exam initiated at 1:54 PM.  Appropriate orders placed.  [name] [name] was informed that the remainder of the evaluation will be completed by another provider, this initial triage assessment does not replace that evaluation, and the importance of remaining in the ED until their evaluation is complete.  Orders: CBC, CMP, Chem 8, CBG, PT/INR, PTT, Ethanol, CT head    [name] Terrall FALCON, PA-C 10/12/24 1358

## 2024-10-12 NOTE — ED Provider Notes (Signed)
 " [facility] Provider Note   CSN: [id number] Arrival date & time: 10/12/24  1306     History Chief Complaint  Patient presents with   Headache    HPI: [name] [name] is a 59 y.o. 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 Patient arrived via POV.  History provided by patient.  No interpreter required during this encounter.  Patient reports that last night while she was at rest she was hanging out with her boyfriend when she suddenly began having difficulty speaking.  Denies any difficulty pronouncing the words, just with word finding.  Reports that her boyfriend states that this was also correlated with a right sided facial droop (triage note states left-sided facial droop, patient reports that she told the triage her the incorrect side).  Reports that the episode lasted 5 to 10 minutes and resolved spontaneously.  It was not associated with any numbness or tingling on her face, no weakness, numbness, tingling of her body.  Reports that since then she has had some mild head pressure, which she states was not similar to her previous subarachnoid hemorrhage remotely.  Reports that she does have an aunt who had a previous Chelcea, therefore she came to the emergency department today to get a workup, however given her symptoms resolved spontaneously last night she did not feel that she needed to present overnight since she felt that it was unlikely to be a stroke given the symptoms were transient.  Denies any fever, chills, chest pain, shortness of breath, nausea, vomiting, diarrhea.  Denies history of hypertension, diabetes.  Reports that she previously was on Plavix for a month after each of her 2 coils were placed, however does not regularly take any anticoagulants or antiplatelets.  Patient reports that she was getting  regular MRIs every 3 years initially after her coils and ICH, however reports that that she stopped doing them after a while because they were a financial barrier, and she was also tired of having to repeatedly hear about her mortality.  Patient's recorded medical, surgical, social, medication list and allergies were reviewed in the Snapshot window as part of the initial history.   Prior to Admission medications  Not on File     Allergies: Patient has no known allergies.   Review of Systems   ROS as per HPI  Physical Exam Updated Vital Signs BP (!) 193/102   Pulse 91   Temp 97.8 F (36.6 C)   Resp (!) 23   Ht 5' 1 (1.549 m)   Wt 56.7 kg   SpO2 96%   BMI 23.62 kg/m  Physical Exam Vitals and nursing note reviewed.  Constitutional:      General: She is not in acute distress.    Appearance: She is well-developed.  HENT:     Head: Normocephalic and atraumatic.  Eyes:     Conjunctiva/sclera: Conjunctivae normal.  Cardiovascular:     Rate and Rhythm: Normal rate and regular rhythm.     Heart sounds: No murmur heard. Pulmonary:     Effort: Pulmonary effort is normal. No respiratory distress.     Breath sounds: Normal breath sounds.  Abdominal:     Palpations: Abdomen is soft.     Tenderness: There is no abdominal tenderness.  Musculoskeletal:        General: No swelling.     Cervical back: Neck  supple.  Skin:    General: Skin is warm and dry.     Capillary Refill: Capillary refill takes less than 2 seconds.  Neurological:     Mental Status: She is alert.     GCS: GCS eye subscore is 4. GCS verbal subscore is 5. GCS motor subscore is 6.     Cranial Nerves: No cranial nerve deficit, dysarthria or facial asymmetry.     Motor: No weakness.     Coordination: Finger-Nose-Finger Test and Heel to [employer] normal.  Psychiatric:        Mood and Affect: Mood normal.     ED Course/ Medical Decision Making/ A&P    Procedures Procedures   Medications Ordered in  ED Medications - No data to display  Medical Decision Making:   [name] [name] is a 59 y.o. female who presents for transient episode of neurologic deficits and headache as per above.  Physical exam is pertinent for no focal abnormalities.   The differential includes but is not limited to ICH, stroke, Kailiana, headache, complication of cerebral AVMs.  Independent historian: None  External data reviewed: Notes: Reviewed prior op notes from coiling, notes were converted from paper charts, uploaded in 2012, however appears that the H&P describing patient's hospitalization for her ICH was in September 2002 when she was 59 years old.  Initial Plan:  Screening labs including CBC and Metabolic panel to evaluate for infectious or metabolic etiology of disease.  Screening coags, ethanol given patient reports CT head to evaluate for structural/vascular intra-cranial pathology.  Objective evaluation as below reviewed   Labs: Ordered, Independent interpretation, and Details: Ethanol negative.  CBC without leukocytosis, anemia, thrombocytopenia.  Coags reassuring, point-of-care glucose WNL, CMP without AKI, emergent electrolyte derangement, emergent LFT abnormality  Radiology: Ordered, Independent interpretation, Details: Reviewed CT of the head, I do appreciate mild atrophy, I do not appreciate ICH, displaced fracture, and All images reviewed independently. ***Agree with radiology report at this time.   CT HEAD WO CONTRAST Result Date: 10/12/2024 EXAM: CT HEAD WITHOUT CONTRAST 10/12/2024 02:17:00 PM TECHNIQUE: CT of the head was performed without the administration of intravenous contrast. Automated exposure control, iterative reconstruction, and/or weight based adjustment of the mA/kV was utilized to reduce the radiation dose to as low as reasonably achievable. COMPARISON: 01/23/2018 CLINICAL HISTORY: headache, episode of left sided facial droop and trouble with word finding yesterday, hx of aneurysm headache,  episode of left sided facial droop and trouble with word finding yesterday, hx of aneurysm headache, episode of left sided facial droop and trouble with word finding yesterday, hx of aneurysm FINDINGS: BRAIN AND VENTRICLES: No acute hemorrhage. Moderate chronic microvascular ischemic change. 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 Subacute to chronic appearing infarct in the high left parietal lobe. Coils noted in the right parasellar region compatible with history of treated posterior communicating artery aneurysm. Streak artifact from the aneurysm coil slightly limits evaluation of the basilar cisterns and the inferior aspects of the cerebellar hemispheres. There is a partially visualized stent in the left ICA. No hydrocephalus. No extra-axial collection. No mass effect or midline shift. ORBITS: No acute abnormality. SINUSES: No acute abnormality. SOFT TISSUES AND SKULL: No acute soft tissue abnormality. No skull fracture. IMPRESSION: 1. No acute intracranial abnormality. 2. Subacute to chronic appearing infarct in the high left parietal lobe. Consider MRI for further evaluation. 3. 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 4. Moderate chronic microvascular ischemic change. 5. Coiled right posterior communicating artery aneurysm. 6. Partially visualized  stent in the left ICA. Electronically signed by: Donnice Mania MD 10/12/2024 02:26 PM EST RP Workstation: [id number]    EKG/Medicine tests: Not indicated EKG Interpretation:    Interventions: LR, Tylenol , Compazine , Ativan   See the EMR for full details regarding lab and imaging results.  Patient presents to the emergency department for a episode of brief neurologic deficits last night, as well as some mild headache that has been ongoing since that time.  Patient is well-appearing on exam, has no focal neurologic deficits, reports that she has mild pressure/headache, however states that it is not dissimilar to her prior subarachnoid hemorrhage.  Will  treat patient's headache with multimodal headache cocktail.  ABCD2 score was calculated and is overall low risk with a score of 2, however, CT does show some subacute and chronic strokes that patient was not previously aware of, therefore do feel that patient would benefit from MRI to differentiate whether or not the strokes are subacute versus chronic, particularly given she is not on any antiplatelet medication.  Notably, patient was hemodynamically stable, initially hypertensive and normal heart rate at the beginning of my evaluation, heart rate in the 80s, systolic blood pressure in the low 190s, however patient reports some healthcare anxiety and became anxious during our discussion, as she does not like to think about her death, and during this conversation she had increasing heart rate and increasing blood pressure, which I feel is likely anxiety mediated.  Patient also reports that she does have some claustrophobia and MRIs, therefore will order oral Ativan  that she can take p.o. prior to her MRI.  {LSCOPA:33420}  Discussion of management or test interpretations with external provider(s): ***  Risk Drugs:{LSDRUGS:33399} Treatment: {LSTREATMENT:33409} Surgery:{LSSURGERY:33410} Critical Care: ***  Disposition: {LSDISPO:33388}  MDM generated using voice dictation software and may contain dictation errors.  Please contact me for any clarification or with any questions.  Clinical Impression: No diagnosis found.   Data Unavailable   Final Clinical Impression(s) / ED Diagnoses Final diagnoses:  None    Rx / DC Orders ED Discharge Orders     None      "

## 2024-10-13 ENCOUNTER — Observation Stay (HOSPITAL_COMMUNITY): Payer: MEDICAID

## 2024-10-13 DIAGNOSIS — R569 Unspecified convulsions: Secondary | ICD-10-CM | POA: Diagnosis not present

## 2024-10-13 DIAGNOSIS — R29700 NIHSS score 0: Secondary | ICD-10-CM | POA: Diagnosis not present

## 2024-10-13 DIAGNOSIS — G0490 Encephalitis and encephalomyelitis, unspecified: Secondary | ICD-10-CM

## 2024-10-13 DIAGNOSIS — I739 Peripheral vascular disease, unspecified: Secondary | ICD-10-CM

## 2024-10-13 DIAGNOSIS — I6389 Other cerebral infarction: Secondary | ICD-10-CM | POA: Diagnosis not present

## 2024-10-13 DIAGNOSIS — G459 Transient cerebral ischemic attack, unspecified: Secondary | ICD-10-CM | POA: Diagnosis not present

## 2024-10-13 DIAGNOSIS — I639 Cerebral infarction, unspecified: Secondary | ICD-10-CM | POA: Diagnosis not present

## 2024-10-13 LAB — COMPREHENSIVE METABOLIC PANEL WITH GFR
ALT: 11 U/L (ref 0–44)
AST: 25 U/L (ref 15–41)
Albumin: 4 g/dL (ref 3.5–5.0)
Alkaline Phosphatase: 110 U/L (ref 38–126)
Anion gap: 13 (ref 5–15)
BUN: 8 mg/dL (ref 6–20)
CO2: 22 mmol/L (ref 22–32)
Calcium: 9.6 mg/dL (ref 8.9–10.3)
Chloride: 104 mmol/L (ref 98–111)
Creatinine, Ser: 0.72 mg/dL (ref 0.44–1.00)
GFR, Estimated: >60 mL/min
Glucose, Bld: 81 mg/dL (ref 70–99)
Potassium: 3.9 mmol/L (ref 3.5–5.1)
Sodium: 139 mmol/L (ref 135–145)
Total Bilirubin: 0.5 mg/dL (ref 0.0–1.2)
Total Protein: 7.2 g/dL (ref 6.5–8.1)

## 2024-10-13 LAB — ECHOCARDIOGRAM COMPLETE
AR max vel: 2.14 cm2
AV Area VTI: 2.7 cm2
AV Area mean vel: 2.07 cm2
AV Mean grad: 4 mmHg
AV Peak grad: 8.1 mmHg
Ao pk vel: 1.42 m/s
Area-P 1/2: 1.98 cm2
Calc EF: 63.3 %
Height: 61 in
S' Lateral: 2.5 cm
Single Plane A2C EF: 64.5 %
Single Plane A4C EF: 60.4 %
Weight: 2000 [oz_av]

## 2024-10-13 LAB — CBC
HCT: 44.7 % (ref 36.0–46.0)
Hemoglobin: 14.9 g/dL (ref 12.0–15.0)
MCH: 28.8 pg (ref 26.0–34.0)
MCHC: 33.3 g/dL (ref 30.0–36.0)
MCV: 86.5 fL (ref 80.0–100.0)
Platelets: 201 K/uL (ref 150–400)
RBC: 5.17 MIL/uL — ABNORMAL HIGH (ref 3.87–5.11)
RDW: 14.7 % (ref 11.5–15.5)
WBC: 7.7 K/uL (ref 4.0–10.5)
nRBC: 0 % (ref 0.0–0.2)

## 2024-10-13 LAB — LIPID PANEL
Cholesterol: 332 mg/dL — ABNORMAL HIGH (ref 0–200)
HDL: 29 mg/dL — ABNORMAL LOW
LDL Cholesterol: 244 mg/dL — ABNORMAL HIGH (ref 0–99)
Total CHOL/HDL Ratio: 11.3 ratio
Triglycerides: 292 mg/dL — ABNORMAL HIGH
VLDL: 58 mg/dL — ABNORMAL HIGH (ref 0–40)

## 2024-10-13 LAB — HEMOGLOBIN A1C
Hgb A1c MFr Bld: 5.5 % (ref 4.8–5.6)
Mean Plasma Glucose: 111.15 mg/dL

## 2024-10-13 MED ORDER — LORAZEPAM 2 MG/ML IJ SOLN
1.0000 mg | INTRAMUSCULAR | Status: AC | PRN
  Administered 2024-10-13: 1 mg via INTRAVENOUS
  Filled 2024-10-13: qty 1

## 2024-10-13 MED ORDER — ASPIRIN 81 MG PO TBEC
81.0000 mg | DELAYED_RELEASE_TABLET | Freq: Every day | ORAL | Status: DC
  Administered 2024-10-13 – 2024-10-14 (×2): 81 mg via ORAL
  Filled 2024-10-13 (×2): qty 1

## 2024-10-13 MED ORDER — GADOBUTROL 1 MMOL/ML IV SOLN
5.0000 mL | Freq: Once | INTRAVENOUS | Status: AC | PRN
  Administered 2024-10-13: 5 mL via INTRAVENOUS

## 2024-10-13 MED ORDER — AMLODIPINE BESYLATE 5 MG PO TABS
5.0000 mg | ORAL_TABLET | Freq: Once | ORAL | Status: AC
  Administered 2024-10-13: 5 mg via ORAL
  Filled 2024-10-13: qty 1

## 2024-10-13 MED ORDER — AMLODIPINE BESYLATE 5 MG PO TABS
5.0000 mg | ORAL_TABLET | Freq: Every day | ORAL | Status: DC
  Administered 2024-10-13: 5 mg via ORAL
  Filled 2024-10-13: qty 1

## 2024-10-13 MED ORDER — NICOTINE POLACRILEX 2 MG MT GUM: 2.0000 mg | CHEWING_GUM | OROMUCOSAL | Status: DC | PRN

## 2024-10-13 MED ORDER — ATORVASTATIN CALCIUM 80 MG PO TABS
80.0000 mg | ORAL_TABLET | Freq: Every day | ORAL | Status: DC
  Filled 2024-10-13: qty 1

## 2024-10-13 MED ORDER — HYDRALAZINE HCL 20 MG/ML IJ SOLN
10.0000 mg | Freq: Four times a day (QID) | INTRAMUSCULAR | Status: DC | PRN
  Administered 2024-10-14: 10 mg via INTRAVENOUS
  Filled 2024-10-13 (×2): qty 1

## 2024-10-13 MED ORDER — LORAZEPAM 2 MG/ML IJ SOLN: 1.0000 mg | Freq: Once | INTRAMUSCULAR | Status: DC

## 2024-10-13 MED ORDER — AMLODIPINE BESYLATE 5 MG PO TABS
10.0000 mg | ORAL_TABLET | Freq: Every day | ORAL | Status: DC
  Administered 2024-10-14: 10 mg via ORAL
  Filled 2024-10-13: qty 2

## 2024-10-13 MED ORDER — EZETIMIBE 10 MG PO TABS
10.0000 mg | ORAL_TABLET | Freq: Every day | ORAL | Status: DC
  Administered 2024-10-13 – 2024-10-14 (×2): 10 mg via ORAL
  Filled 2024-10-13 (×2): qty 1

## 2024-10-13 MED ORDER — ENOXAPARIN SODIUM 40 MG/0.4ML IJ SOSY
40.0000 mg | PREFILLED_SYRINGE | INTRAMUSCULAR | Status: DC
  Administered 2024-10-13 – 2024-10-14 (×2): 40 mg via SUBCUTANEOUS
  Filled 2024-10-13 (×2): qty 0.4

## 2024-10-13 NOTE — Consult Note (Signed)
 NEUROLOGY CONSULT NOTE   Date of service: October 13, 2024 Patient Name: Emiliana Blaize MRN:  [id number] DOB:  04/02/1966 Chief Complaint: Transient difficulty speaking Requesting Provider: Sundil, Subrina, MD  History of Present Illness  Vallie Valbuena is a 59 y.o. female with hx of previous aneurysm status post coiling who presents with transient difficulty speaking lasting approximately 5 minutes.  She states that she knew in her brain what words she wanted to say, but that she could not get it to come out.  She then sought care in the emergency department as she was quite concerned about this change.  She has never had anything like this happen before, but she has noted some trouble with her memory over the past few years.  No recent changes in sudden confusion, numbness, weakness or other neurological events over the past few weeks.  LKW: Unclear IV Thrombolysis: No, resolution of symptoms EVT: No, no LVO   NIHSS components Score: Comment  1a Level of Conscious 0[x]  1[]  2[]  3[]      1b LOC Questions 0[x]  1[]  2[]       1c LOC Commands 0[x]  1[]  2[]       2 Best Gaze 0[x]  1[]  2[]       3 Visual 0[x]  1[]  2[]  3[]      4 Facial Palsy 0[x]  1[]  2[]  3[]      5a Motor Arm - left 0[x]  1[]  2[]  3[]  4[]  UN[]    5b Motor Arm - Right 0[x]  1[]  2[]  3[]  4[]  UN[]    6a Motor Leg - Left 0[x]  1[]  2[]  3[]  4[]  UN[]    6b Motor Leg - Right 0[x]  1[]  2[]  3[]  4[]  UN[]    7 Limb Ataxia 0[x]  1[]  2[]  UN[]      8 Sensory 0[x]  1[]  2[]  UN[]      9 Best Language 0[x]  1[]  2[]  3[]      10 Dysarthria 0[x]  1[]  2[]  UN[]      11 Extinct. and Inattention 0[x]  1[]  2[]       TOTAL: 0      Past History  History reviewed. No pertinent past medical history.  History reviewed. No pertinent surgical history.  Family History: History reviewed. No pertinent family history.  Social History  reports that she has been smoking. She has never used smokeless tobacco. She reports current alcohol use. No history on file for drug  use.  Allergies[1]  Medications  Current Medications[2]  Vitals   Vitals:   10/12/24 2245 10/12/24 2247 10/12/24 2300 10/13/24 0204  BP: (!) 137/91  (!) 142/88 (!) 158/85  Pulse: 66  68 76  Resp: (!) 21  20 17   Temp:  98.3 F (36.8 C)  98 F (36.7 C)  TempSrc:  Oral  Oral  SpO2: 96%  95% 100%  Weight:      Height:        Body mass index is 23.62 kg/m.   Physical Exam   Constitutional: Appears well-developed and well-nourished.  Neurologic Examination    Neuro: Mental Status: Patient is awake, alert, oriented to person, place, month, year, and situation. Patient is able to give a clear and coherent history. No signs of aphasia or neglect Cranial Nerves: II: Visual Fields are full. Pupils are equal, round, and reactive to light.   III,IV, VI: EOMI without ptosis or diploplia.  V: Facial sensation is symmetric to temperature VII: Facial movement is symmetric.  VIII: hearing is intact to voice X: Uvula elevates symmetrically XII: tongue is midline without atrophy or fasciculations.  Motor: Tone is normal. Bulk is  normal. 5/5 strength was present in all four extremities.  Sensory: Sensation is symmetric to light touch and temperature in the arms and legs. Cerebellar: FNF and HKS are intact bilaterally   Labs/Imaging/Neurodiagnostic studies   CBC:  Recent Labs  Lab 10/15/2024 1351 10/15/2024 1408  WBC 9.2  --   NEUTROABS 6.1  --   HGB 16.1* 15.6*  HCT 47.7* 46.0  MCV 86.1  --   PLT 252  --    Basic Metabolic Panel:  Lab Results  Component Value Date   NA 141 10-15-24   K 4.2 10/15/24   CO2 25 15-Oct-2024   GLUCOSE 110 (H) 10/15/24   BUN 12 10/15/2024   CREATININE 0.90 2024/10/15   CALCIUM  10.1 10/15/2024   GFRNONAA >60 October 15, 2024   Lipid Panel: No results found for: LDLCALC HgbA1c:  Lab Results  Component Value Date   HGBA1C 5.5 October 15, 2024   Urine Drug Screen: No results found for: LABOPIA, COCAINSCRNUR, LABBENZ, AMPHETMU,  THCU, LABBARB  Alcohol Level     Component Value Date/Time   ETH <15 October 15, 2024 1351   INR  Lab Results  Component Value Date   INR 1.0 15-Oct-2024   APTT  Lab Results  Component Value Date   APTT 32 10/15/24    MRI Brain(Personally reviewed): Multiple areas of old small vessel strokes, her previous EVD tract in the right frontal region is apparent, and there is a region of T2 change without associated restricted fusion in the left parietal region.  ASSESSMENT   Jerra Huckeby is a 59 y.o. female with extensive small vessel disease, previous aneurysm clipping who presents with a transient episode of aphasia.  She has been admitted for a Jaquelinne workup, and I think Desarae is indeed likely, however the area of edema in the left parietal region though could represent a subacute stroke, I think could also represent amyloid related inflammation.  For now, I would favor treating this as Latausha, but I will also order a an EEG and I think she will need sequential imaging of the area in the left parietal region.  RECOMMENDATIONS  - HgbA1c, fasting lipid panel - Frequent neuro checks - Echocardiogram - Prophylactic therapy-Antiplatelet med: Aspirin  - dose 81mg   - Risk factor modification - Telemetry monitoring - PT consult, OT consult, Speech consult - EEG - Stroke team to follow ______________________________________________________________________    Signed, Aisha Seals, MD [facility]     [1] No Known Allergies [2]  Current Facility-Administered Medications:     stroke: early stages of recovery book, , Does not apply, Once, Sundil, Subrina, MD   acetaminophen  (TYLENOL ) tablet 650 mg, 650 mg, Oral, Q4H PRN **OR** acetaminophen  (TYLENOL ) 160 MG/5ML solution 650 mg, 650 mg, Per Tube, Q4H PRN **OR** acetaminophen  (TYLENOL ) suppository 650 mg, 650 mg, Rectal, Q4H PRN, Sundil, Subrina, MD   labetalol  (NORMODYNE ) injection 10 mg, 10 mg, Intravenous, Q2H PRN, Sundil,  Subrina, MD   ondansetron  (ZOFRAN ) injection 4 mg, 4 mg, Intravenous, Q6H PRN, Sundil, Subrina, MD   senna-docusate (Senokot-S) tablet 1 tablet, 1 tablet, Oral, QHS PRN, Sundil, Subrina, MD No current outpatient medications on file.

## 2024-10-13 NOTE — ED Notes (Signed)
 Patient sitting up eating breakfast, denies pain. Ambulatory to restroom with normal gait.

## 2024-10-13 NOTE — Progress Notes (Signed)
 EEG complete - results pending

## 2024-10-13 NOTE — Progress Notes (Addendum)
 " PROGRESS NOTE    [name] [name]  FMW:[id number] DOB: 12-05-1965 DOA: 10/12/2024 PCP: Patient, No Pcp Per   Brief Narrative:  [name] [name] is a 59 y.o. female with medical history significant of intracranial aneurysm with prior subarachnoid hemorrhage requiring 2 previous coiling presented to  emergency department complaining of multiple issues include unable to getting words out from her mouth, right sided-sided facial droop, left-sided headache stabbing in quality yesterday. Per patient she has history of ruptured aneurysm in the brain 22 hours ago status post coiling.  No other complaint.  At presentation to ED patient was hypertensive blood pressure 226/133, otherwise hemodynamically stable.  Basic lab work was unremarkable.  CT head was concerning for subacute or chronic infarct in the high left peritoneal lobe, MRI ruled out stroke.  Patient seen by neurology.  Concern for Katena.  Assessment & Plan:   Principal Problem:   Acute CVA (cerebrovascular accident) [facility]) Active Problems:   History of intracranial aneurysm   History of subarachnoid hemorrhage   Hypertensive emergency  History of subarachnoid hemorrhage and intracranial aneurysm status post coiling now with [name] [name], POA: CT head followed by MRI brain negative for acute stroke.  Initial MRI without contrast was concerning for left parietal edema and expansion without correlated restricted diffusion.  Right frontal lobe gliosis and moderate chronic microvascular ischemic changes.  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  MRI with contrast did not show enhancement.  Neurology is following.  They have ordered EEG.  Echo is also pending.  PT OT evaluation is pending as well.  Patient passed bedside swallow, on regular diet now.  Hemoglobin A1c 5.5.  Awaiting further recommendations by neurology.  Hypertensive emergency: No prior history of hypertension, presented with significantly elevated blood pressure 226/133.  Patient was not  treated to allow permissive hypertension however stroke is ruled out, blood pressure is improving, I am starting the patient on amlodipine  5 mg scheduled and as needed IV hydralazine .  Hyperlipidemia: Total cholesterol 332, triglyceride 292, LDL 244.  Ordered atorvastatin  80 mg this morning however with neurology, patient refused to take statins, she is going to follow-up with the lipid clinic, she has been started on Zetia .  Tobacco abuse: I have discussed tobacco cessation with the patient.  I have counseled the patient regarding the negative impacts of continued tobacco use including but not limited to lung cancer, COPD, and cardiovascular disease.  I have discussed alternatives to tobacco and modalities that may help facilitate tobacco cessation including but not limited to biofeedback, hypnosis, and medications.  Total time spent with tobacco counseling was 5 minutes.  Addendum: Received a message from the nurse to give a call to the mother.  Spoke to the mother, she typically lives in [place] but she is currently in [place] .  Patient lives by herself, she confirmed.  I updated the mother about all the findings that we found and recommendations by neurology.  Also, patient's blood pressure although improved but is still significantly elevated.  She received amlodipine  5 mg earlier, I am giving her another 5 mg now and increasing the dose to 10 mg tomorrow morning.  Would be safer to keep her overnight and make sure her blood pressure is very well-controlled before we discharge her potentially tomorrow.  DVT prophylaxis: enoxaparin  (LOVENOX ) injection 40 mg Start: 10/13/24 1000 Place TED hose Start: 10/13/24 0436 SCD's Start: 10/12/24 2035 Place and maintain sequential compression device Start: 10/12/24 2035 Place TED hose Start: 10/12/24 2035 Place TED hose Start: 10/12/24 2035  Code Status: Full Code  Family Communication:  None present at bedside.  Plan of care discussed with patient in length  and he/she verbalized understanding and agreed with it.  Status is: Observation The patient will require care spanning > 2 midnights and should be moved to inpatient because: Pending [name] [name]/stroke workup and clearance by neurology.   Estimated body mass index is 23.62 kg/m as calculated from the following:   Height as of this encounter: 5' 1 (1.549 m).   Weight as of this encounter: 56.7 kg.    Nutritional Assessment: Body mass index is 23.62 kg/m.[name] [name] Seen by dietician.  I agree with the assessment and plan as outlined below: Nutrition Status:        . Skin Assessment: I have examined the patient's skin and I agree with the wound assessment as performed by the wound care RN as outlined below:    Consultants:  Neurology  Procedures:  None  Antimicrobials:  Anti-infectives (From admission, onward)    None         Subjective: Seen and examined in the morning, was sitting in the chair and eating breakfast.  She had no complaints.  All the symptoms that had resolved yesterday have not recurred.  She had no questions for me.  Objective: Vitals:   10/12/24 2300 10/13/24 0204 10/13/24 0531 10/13/24 0700  BP: (!) 142/88 (!) 158/85 (!) 139/92 (!) 150/86  Pulse: 68 76 71 85  Resp: 20 17 (!) 21 20  Temp:  98 F (36.7 C) (!) 97.5 F (36.4 C)   TempSrc:  Oral    SpO2: 95% 100% 99% 99%  Weight:      Height:       No intake or output data in the 24 hours ending 10/13/24 0737 Filed Weights   10/12/24 1335  Weight: 56.7 kg    Examination:  General exam: Appears calm and comfortable  Respiratory system: Clear to auscultation. Respiratory effort normal. Cardiovascular system: S1 & S2 heard, RRR. No JVD, murmurs, rubs, gallops or clicks. No pedal edema. Gastrointestinal system: Abdomen is nondistended, soft and nontender. No organomegaly or masses felt. Normal bowel sounds heard. Central nervous system: Alert and oriented. No focal neurological deficits. Extremities:  Symmetric 5 x 5 power. Skin: No rashes, lesions or ulcers Psychiatry: Judgement and insight appear normal. Mood & affect appropriate.    Data Reviewed: I have personally reviewed following labs and imaging studies  CBC: Recent Labs  Lab 10/12/24 1351 10/12/24 1408 10/13/24 0540  WBC 9.2  --  7.7  NEUTROABS 6.1  --   --   HGB 16.1* 15.6* 14.9  HCT 47.7* 46.0 44.7  MCV 86.1  --  86.5  PLT 252  --  201   Basic Metabolic Panel: Recent Labs  Lab 10/12/24 1351 10/12/24 1408 10/13/24 0540  NA 139 141 139  K 4.1 4.2 3.9  CL 102 104 104  CO2 25  --  22  GLUCOSE 104* 110* 81  BUN 11 12 8   CREATININE 0.79 0.90 0.72  CALCIUM  10.1  --  9.6   GFR: Estimated Creatinine Clearance: 57.1 mL/min (by C-G formula based on SCr of 0.72 mg/dL). Liver Function Tests: Recent Labs  Lab 10/12/24 1351 10/13/24 0540  AST 25 25  ALT 12 11  ALKPHOS 132* 110  BILITOT 0.5 0.5  PROT 8.1 7.2  ALBUMIN 4.4 4.0   No results for input(s): LIPASE, AMYLASE in the last 168 hours. No results for input(s): AMMONIA in the last  168 hours. Coagulation Profile: Recent Labs  Lab 10/12/24 1351  INR 1.0   Cardiac Enzymes: No results for input(s): CKTOTAL, CKMB, CKMBINDEX, TROPONINI in the last 168 hours. BNP (last 3 results) No results for input(s): PROBNP in the last 8760 hours. HbA1C: Recent Labs    10/12/24 1351  HGBA1C 5.5   CBG: Recent Labs  Lab 10/12/24 1602  GLUCAP 102*   Lipid Profile: Recent Labs    10/13/24 0550  CHOL 332*  HDL 29*  LDLCALC 244*  TRIG 292*  CHOLHDL 11.3   Thyroid Function Tests: No results for input(s): TSH, T4TOTAL, FREET4, T3FREE, THYROIDAB in the last 72 hours. Anemia Panel: No results for input(s): VITAMINB12, FOLATE, FERRITIN, TIBC, IRON, RETICCTPCT in the last 72 hours. Sepsis Labs: No results for input(s): PROCALCITON, LATICACIDVEN in the last 168 hours.  No results found for this or any previous visit  (from the past 240 hours).   Radiology Studies: MR BRAIN W CONTRAST Result Date: 10/13/2024 EXAM: MRI BRAIN WITH CONTRAST 10/13/2024 02:36:00 AM TECHNIQUE: Multiplanar multisequence MRI of the head/brain was performed with the administration of intravenous contrast. CONTRAST: 5 mL Gadavist  COMPARISON: None available. CLINICAL HISTORY: Vasculitis suspected, CNS FINDINGS: Post-contrast imaging performed to further evaluate abnormality seen on MRI head from yesterday. No abnormal enhancement identified. Dural venous sinuses are patent. IMPRESSION: 1. No abnormal enhancement. Electronically signed by: Glendia Molt MD 10/13/2024 02:50 AM EST RP Workstation: [id number]   CT ANGIO HEAD NECK W WO CM Result Date: 10/12/2024 EXAM: CTA HEAD AND NECK WITH AND WITHOUT 10/12/2024 09:54:42 PM TECHNIQUE: CTA of the head and neck was performed with and without the administration of intravenous contrast. Multiplanar 2D and/or 3D reformatted images are provided for review. Automated exposure control, iterative reconstruction, and/or weight based adjustment of the mA/kV was utilized to reduce the radiation dose to as low as reasonably achievable. Stenosis of the internal carotid arteries measured using NASCET criteria. COMPARISON: None available CLINICAL HISTORY: Stroke, follow up FINDINGS: AORTIC ARCH AND ARCH VESSELS: Limited evaluation due to streak artifact from old venous contrast. Great vessel origins are patent. CERVICAL CAROTID ARTERIES: No dissection, arterial injury, or hemodynamically significant stenosis by NASCET criteria. Left greater than right carotid bifurcation atherosclerosis without greater than 50% stenosis. CERVICAL VERTEBRAL ARTERIES: Limited evaluation of the proximal right vertebral artery due to streak artifact. Within this limitation, no dissection, arterial injury, or significant stenosis. LUNGS AND MEDIASTINUM: Unremarkable. SOFT TISSUES: No acute abnormality. BONES: No acute abnormality. ANTERIOR  CIRCULATION: No significant stenosis of the internal carotid arteries. Apparent stents at the carotid termini bilaterally. Coiled right posterior communicating artery aneurysm with approximately x 3 mm focus of residual opacification/contrast in the neck of the aneurysm (for example, see series 5 image 96)next. No significant stenosis of the anterior cerebral arteries. No significant stenosis of the middle cerebral arteries. POSTERIOR CIRCULATION: Limited evaluation of the proximal right posterior cerebral artery due to streak artifact from adjacent coiled mass. Within this limitation, no significant stenosis of the posterior cerebral arteries. No significant stenosis of the basilar artery. No significant stenosis of the vertebral arteries. No aneurysm. OTHER: No dural venous sinus thrombosis on this non-dedicated study. IMPRESSION: 1. No evidence of AVM in the left parietal abnormality seen on same day MRI. 2. No large vessel occlusion or hemodynamically significant stenosis. 3. Coiled right posterior communicating artery aneurysm with approximately 5 x 3 mm focus of residual opacification/contrast in the neck of the aneurysm. Electronically signed by: Glendia Molt MD 10/12/2024 10:39 PM EST RP  Workstation: [id number]   MR BRAIN WO CONTRAST Result Date: 10/12/2024 EXAM: MRI Brain Without Contrast 10/12/2024 07:36:36 PM TECHNIQUE: Multiplanar multisequence MRI of the head/brain was performed without the administration of intravenous contrast. COMPARISON: None available. CLINICAL HISTORY: Stroke, follow up; Transient ischemic attack ([name] [name]) FINDINGS: BRAIN AND VENTRICLES: No acute infarct. Left parietal edema without correlate restricted diffusion. Mild expansion of cortex in this area with many small foci of susceptibility artifact some of which may be tubular. Right frontal lobe gliosis and moderate scattered T2 hyperintensities in the white matter compatible with chronic microvascular ischemic change. No acute  intracranial hemorrhage. No midline shift. No hydrocephalus. The sella is unremarkable. Coiled right posterior communicating artery aneurysm. ORBITS: No significant abnormality. SINUSES AND MASTOIDS: No significant abnormality. BONES AND SOFT TISSUES: Normal marrow signal. No soft tissue abnormality. IMPRESSION: 1. Left parietal edema and mild expansion without correlate restricted diffusion. While this most likely represents evolving subacute to chronic infarct, the complete absence of restricted diffusion is somewhat atypical and recommend post-contrast imaging to help exclude malignancy. Also, areas of susceptibility artifact in this region may represent petechial hemorrhage, but recommend CTA head to help exclude underlying vascular malformation. 2. Right frontal lobe gliosis and moderate chronic microvascular ischemic change. 3. Coiled right posterior communicating artery aneurysm. Electronically signed by: Glendia Molt MD 10/12/2024 08:12 PM EST RP Workstation: [id number]   CT HEAD WO CONTRAST Result Date: 10/12/2024 EXAM: CT HEAD WITHOUT CONTRAST 10/12/2024 02:17:00 PM TECHNIQUE: CT of the head was performed without the administration of intravenous contrast. Automated exposure control, iterative reconstruction, and/or weight based adjustment of the mA/kV was utilized to reduce the radiation dose to as low as reasonably achievable. COMPARISON: 01/23/2018 CLINICAL HISTORY: headache, episode of left sided facial droop and trouble with word finding yesterday, hx of aneurysm headache, episode of left sided facial droop and trouble with word finding yesterday, hx of aneurysm headache, episode of left sided facial droop and trouble with word finding yesterday, hx of aneurysm FINDINGS: BRAIN AND VENTRICLES: No acute hemorrhage. Moderate chronic microvascular ischemic change. 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 Subacute to chronic appearing infarct in the high left parietal lobe. Coils noted in the right  parasellar region compatible with history of treated posterior communicating artery aneurysm. Streak artifact from the aneurysm coil slightly limits evaluation of the basilar cisterns and the inferior aspects of the cerebellar hemispheres. There is a partially visualized stent in the left ICA. No hydrocephalus. No extra-axial collection. No mass effect or midline shift. ORBITS: No acute abnormality. SINUSES: No acute abnormality. SOFT TISSUES AND SKULL: No acute soft tissue abnormality. No skull fracture. IMPRESSION: 1. No acute intracranial abnormality. 2. Subacute to chronic appearing infarct in the high left parietal lobe. Consider MRI for further evaluation. 3. 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 4. Moderate chronic microvascular ischemic change. 5. Coiled right posterior communicating artery aneurysm. 6. Partially visualized stent in the left ICA. Electronically signed by: Donnice Mania MD 10/12/2024 02:26 PM EST RP Workstation: [id number]    Scheduled Meds:   stroke: early stages of recovery book   Does not apply Once   amLODipine   5 mg Oral Daily   aspirin  EC  81 mg Oral Daily   atorvastatin   80 mg Oral Daily   enoxaparin  (LOVENOX ) injection  40 mg Subcutaneous Q24H   Continuous Infusions:   LOS: 0 days   Fredia Skeeter, MD [facility]  10/13/2024, 7:37 AM   *Please note that this is a verbal dictation therefore any spelling  or grammatical errors are due to the [employer] system interpretation.  Please page via Amion and do not message via secure chat for urgent patient care matters. Secure chat can be used for non urgent patient care matters.  How to contact the [facility] Attending or Consulting provider 7A - 7P or covering provider during after hours 7P -7A, for this patient?  Check the care team in [facility] and look for a) attending/consulting [facility] provider listed and b) the [facility] team listed. Page or secure chat 7A-7P. Log into [email or web address] and use [place]'s universal  password to access. If you do not have the password, please contact the hospital operator. Locate the [facility] provider you are looking for under [facility] and page to a number that you can be directly reached. If you still have difficulty reaching the provider, please page the [facility] (Director on Call) for the Hospitalists listed on amion for assistance.  "

## 2024-10-13 NOTE — Progress Notes (Addendum)
 STROKE TEAM PROGRESS NOTE    INTERIM HISTORY/SUBJECTIVE  Patient is seen in her room with no family at the bedside.  She has remained hemodynamically stable and afebrile overnight.  She has had no further episodes of aphasia.  OBJECTIVE  CBC    Component Value Date/Time   WBC 7.7 10/13/2024 0540   RBC 5.17 (H) 10/13/2024 0540   HGB 14.9 10/13/2024 0540   HCT 44.7 10/13/2024 0540   PLT 201 10/13/2024 0540   MCV 86.5 10/13/2024 0540   MCH 28.8 10/13/2024 0540   MCHC 33.3 10/13/2024 0540   RDW 14.7 10/13/2024 0540   LYMPHSABS 2.5 10/12/2024 1351   MONOABS 0.4 10/12/2024 1351   EOSABS 0.1 10/12/2024 1351   BASOSABS 0.1 10/12/2024 1351    BMET    Component Value Date/Time   NA 139 10/13/2024 0540   K 3.9 10/13/2024 0540   CL 104 10/13/2024 0540   CO2 22 10/13/2024 0540   GLUCOSE 81 10/13/2024 0540   BUN 8 10/13/2024 0540   CREATININE 0.72 10/13/2024 0540   CALCIUM  9.6 10/13/2024 0540   GFRNONAA >60 10/13/2024 0540    IMAGING past 24 hours MR BRAIN W CONTRAST Result Date: 10/13/2024 EXAM: MRI BRAIN WITH CONTRAST 10/13/2024 02:36:00 AM TECHNIQUE: Multiplanar multisequence MRI of the head/brain was performed with the administration of intravenous contrast. CONTRAST: 5 mL Gadavist  COMPARISON: None available. CLINICAL HISTORY: Vasculitis suspected, CNS FINDINGS: Post-contrast imaging performed to further evaluate abnormality seen on MRI head from yesterday. No abnormal enhancement identified. Dural venous sinuses are patent. IMPRESSION: 1. No abnormal enhancement. Electronically signed by: Glendia Molt MD 10/13/2024 02:50 AM EST RP Workstation: [id number]   CT ANGIO HEAD NECK W WO CM Result Date: 10/12/2024 EXAM: CTA HEAD AND NECK WITH AND WITHOUT 10/12/2024 09:54:42 PM TECHNIQUE: CTA of the head and neck was performed with and without the administration of intravenous contrast. Multiplanar 2D and/or 3D reformatted images are provided for review. Automated exposure control,  iterative reconstruction, and/or weight based adjustment of the mA/kV was utilized to reduce the radiation dose to as low as reasonably achievable. Stenosis of the internal carotid arteries measured using NASCET criteria. COMPARISON: None available CLINICAL HISTORY: Stroke, follow up FINDINGS: AORTIC ARCH AND ARCH VESSELS: Limited evaluation due to streak artifact from old venous contrast. Great vessel origins are patent. CERVICAL CAROTID ARTERIES: No dissection, arterial injury, or hemodynamically significant stenosis by NASCET criteria. Left greater than right carotid bifurcation atherosclerosis without greater than 50% stenosis. CERVICAL VERTEBRAL ARTERIES: Limited evaluation of the proximal right vertebral artery due to streak artifact. Within this limitation, no dissection, arterial injury, or significant stenosis. LUNGS AND MEDIASTINUM: Unremarkable. SOFT TISSUES: No acute abnormality. BONES: No acute abnormality. ANTERIOR CIRCULATION: No significant stenosis of the internal carotid arteries. Apparent stents at the carotid termini bilaterally. Coiled right posterior communicating artery aneurysm with approximately x 3 mm focus of residual opacification/contrast in the neck of the aneurysm (for example, see series 5 image 96)next. No significant stenosis of the anterior cerebral arteries. No significant stenosis of the middle cerebral arteries. POSTERIOR CIRCULATION: Limited evaluation of the proximal right posterior cerebral artery due to streak artifact from adjacent coiled mass. Within this limitation, no significant stenosis of the posterior cerebral arteries. No significant stenosis of the basilar artery. No significant stenosis of the vertebral arteries. No aneurysm. OTHER: No dural venous sinus thrombosis on this non-dedicated study. IMPRESSION: 1. No evidence of AVM in the left parietal abnormality seen on same day MRI. 2. No large vessel occlusion  or hemodynamically significant stenosis. 3. Coiled  right posterior communicating artery aneurysm with approximately 5 x 3 mm focus of residual opacification/contrast in the neck of the aneurysm. Electronically signed by: Glendia Molt MD 10/12/2024 10:39 PM EST RP Workstation: [id number]   MR BRAIN WO CONTRAST Result Date: 10/12/2024 EXAM: MRI Brain Without Contrast 10/12/2024 07:36:36 PM TECHNIQUE: Multiplanar multisequence MRI of the head/brain was performed without the administration of intravenous contrast. COMPARISON: None available. CLINICAL HISTORY: Stroke, follow up; Transient ischemic attack ([name] [name]) FINDINGS: BRAIN AND VENTRICLES: No acute infarct. Left parietal edema without correlate restricted diffusion. Mild expansion of cortex in this area with many small foci of susceptibility artifact some of which may be tubular. Right frontal lobe gliosis and moderate scattered T2 hyperintensities in the white matter compatible with chronic microvascular ischemic change. No acute intracranial hemorrhage. No midline shift. No hydrocephalus. The sella is unremarkable. Coiled right posterior communicating artery aneurysm. ORBITS: No significant abnormality. SINUSES AND MASTOIDS: No significant abnormality. BONES AND SOFT TISSUES: Normal marrow signal. No soft tissue abnormality. IMPRESSION: 1. Left parietal edema and mild expansion without correlate restricted diffusion. While this most likely represents evolving subacute to chronic infarct, the complete absence of restricted diffusion is somewhat atypical and recommend post-contrast imaging to help exclude malignancy. Also, areas of susceptibility artifact in this region may represent petechial hemorrhage, but recommend CTA head to help exclude underlying vascular malformation. 2. Right frontal lobe gliosis and moderate chronic microvascular ischemic change. 3. Coiled right posterior communicating artery aneurysm. Electronically signed by: Glendia Molt MD 10/12/2024 08:12 PM EST RP Workstation: [id number]   CT HEAD  WO CONTRAST Result Date: 10/12/2024 EXAM: CT HEAD WITHOUT CONTRAST 10/12/2024 02:17:00 PM TECHNIQUE: CT of the head was performed without the administration of intravenous contrast. Automated exposure control, iterative reconstruction, and/or weight based adjustment of the mA/kV was utilized to reduce the radiation dose to as low as reasonably achievable. COMPARISON: 01/23/2018 CLINICAL HISTORY: headache, episode of left sided facial droop and trouble with word finding yesterday, hx of aneurysm headache, episode of left sided facial droop and trouble with word finding yesterday, hx of aneurysm headache, episode of left sided facial droop and trouble with word finding yesterday, hx of aneurysm FINDINGS: BRAIN AND VENTRICLES: No acute hemorrhage. Moderate chronic microvascular ischemic change. 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 Subacute to chronic appearing infarct in the high left parietal lobe. Coils noted in the right parasellar region compatible with history of treated posterior communicating artery aneurysm. Streak artifact from the aneurysm coil slightly limits evaluation of the basilar cisterns and the inferior aspects of the cerebellar hemispheres. There is a partially visualized stent in the left ICA. No hydrocephalus. No extra-axial collection. No mass effect or midline shift. ORBITS: No acute abnormality. SINUSES: No acute abnormality. SOFT TISSUES AND SKULL: No acute soft tissue abnormality. No skull fracture. IMPRESSION: 1. No acute intracranial abnormality. 2. Subacute to chronic appearing infarct in the high left parietal lobe. Consider MRI for further evaluation. 3. 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 4. Moderate chronic microvascular ischemic change. 5. Coiled right posterior communicating artery aneurysm. 6. Partially visualized stent in the left ICA. Electronically signed by: Donnice Mania MD 10/12/2024 02:26 PM EST RP Workstation: [id number]    Vitals:   10/13/24 0531 10/13/24  0700 10/13/24 0941 10/13/24 1000  BP: (!) 139/92 (!) 150/86  (!) 145/97  Pulse: 71 85  87  Resp: (!) 21 20  (!) 24  Temp: (!) 97.5 F (36.4 C)  98 F (36.7  C)   TempSrc:   Oral   SpO2: 99% 99%  93%  Weight:      Height:       PHYSICAL EXAM General:  Alert, well-nourished, well-developed patient in no acute distress Psych:  Mood and affect appropriate for situation CV: Regular rate and rhythm on monitor Respiratory:  Regular, unlabored respirations on room air   NEURO:  Mental Status: AA&Ox3, patient is able to give clear and coherent history Speech/Language: speech is without dysarthria or aphasia.  Naming, repetition, fluency, and comprehension intact.  Cranial Nerves:  II: PERRL. Visual fields full.  III, IV, VI: EOMI. Eyelids elevate symmetrically.  V: Sensation is intact to light touch and symmetrical to face.  VII: Face is symmetrical resting and smiling VIII: hearing intact to voice. IX, X: Phonation is normal.  KP:[name] [name] shrug 5/5. XII: tongue is midline without fasciculations. Motor: Able to move all 4 extremities with symmetrical antigravity strength Tone: is normal and bulk is normal Sensation- Intact to light touch bilaterally.  Coordination: FTN intact bilaterally Gait- deferred  Most Recent NIH 0   ASSESSMENT/PLAN  Ms. [name] [name] is a 59 y.o. female with history of aneurysm status post coiling admitted for transient episode of difficulty and word finding which lasted about 5 minutes.  Patient states that she knew what she wanted to say but could not get the words to come out.  She has never had a similar episode in the past.  MRI brain with and without contrast was negative for acute infarct.  However, numerous microhemorrhages were noted, as well as new T2 hyperintensity in the left parietal region which is new from the prior scan of 2010.  Given history of SAH and aneurysm as well as history of EVD, EEG was ordered-it showed cortical dysfunction from left  parietal region likely secondary to underlying structural abnormality.  Impression: More likely [name] [name] Less likely left hemispheric dysfunction from electrographic abnormality such as a seizure.  [name] [name]: Left-sided [name] [name] versus seizure activity CT head No acute abnormality.  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, moderate chronic microvascular ischemic changes, coiled right PCA aneurysm CTA head & neck no evidence of AVM, no LVO or hemodynamically significant stenosis, quelled right PCA aneurysm with small focus of residual opacification in neck of the aneurysm MRI with and without contrast left parietal edema and mild expansion without restricted diffusion, likely representing subacute or chronic infarct, right frontal lobe gliosis and moderate chronic microvascular ischemic changes, coiled right PCA aneurysm, numerous microhemorrhages, no abnormal enhancement seen on the images with contrast 2D Echo: EF 60 to 65%, grade 1 diastolic dysfunction, mitral valve normal, aortic valve normal, LA size normal, no shunt by color flow. LDL 244 HgbA1c 5.5 VTE prophylaxis -Lovenox  No antithrombotic prior to admission, I would start her on aspirin  81. Therapy recommendations:  Pending Disposition: Pending, likely home EEG with no evidence of seizures-cortical dysfunction from left parietal region likely secondary to underlying abnormality and generalized cerebral dysfunction.  If she were to have any stereotypic episodes, I would have low threshold to start her on an antiepileptic at that time but this being the first episode, even if this were a seizure, I am not going to recommend initiation of antiepileptics.  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 [name] [name] , MSN, AGACNP-BC [facility] See Amion for schedule and pager information 10/13/2024 11:03 AM       Attending Neurohospitalist Addendum Patient seen and examined with APP/Resident. Agree with the history and physical as documented above. Agree with the plan as documented, which I helped formulate. I have independently reviewed the chart, obtained history, review of systems and examined the patient.I have personally reviewed pertinent head/neck/spine imaging (CT/MRI).  Plan was relayed to the hospitalist. Please feel free to call with any questions.  -- Eligio Lav, MD Neurologist [facility] Pager: [phone]

## 2024-10-13 NOTE — Progress Notes (Signed)
 PT Cancellation Note  Patient Details Name: [name] [name] MRN: [id number] DOB: [birth year]   Cancelled Treatment:    Reason Eval/Treat Not Completed: PT screened, no needs identified, will sign off. Pt reports no mobility concerns or deficits at this time and has been ambulating independently per discussion with OT. Pt has no current acute PT needs. PT signing off.   Bernardino JINNY Ruth 10/13/2024, 11:31 AM

## 2024-10-13 NOTE — Evaluation (Addendum)
 Occupational Therapy Evaluation Patient Details Name: [name] [name] MRN: [id number] DOB: 1966/06/24 Today's Date: 10/13/2024   History of Present Illness   59 y.o. female presents to [facility] hospital  on 10/12/2024 with transient difficulty speaking. CT head with subacute to chronic infarct in L parietal lobe. PMH includes prior aneurysm s/p coiling.     Clinical Impressions [name] [name] was evaluated s/p the above admission list. She lives with a roommate, is indep, works and drives at baseline. Upon evaluation, pt demonstrated indep ability to complete mobility and ADLs. Pt reports feeling back to baseline including speech. Reviewed BE FAST, pt receptive. Pt does not require further acute, or follow up OT services. Recommend discharge back to pt's environment with assist as needed. OT to sign off with appreciation of order, please re-consult if needed.       If plan is discharge home, recommend the following:   Assist for transportation;Assistance with cooking/housework     Functional Status Assessment   Patient has had a recent decline in their functional status and demonstrates the ability to make significant improvements in function in a reasonable and predictable amount of time.     Equipment Recommendations   None recommended by OT      Precautions/Restrictions   Precautions Precautions: Fall Restrictions Weight Bearing Restrictions Per Provider Order: No     Mobility Bed Mobility Overal bed mobility: Needs Assistance Bed Mobility: Supine to Sit, Sit to Supine     Supine to sit: Independent Sit to supine: Independent        Transfers Overall transfer level: Independent                        Balance Overall balance assessment: Independent                                         ADL either performed or assessed with clinical judgement   ADL Overall ADL's : Modified independent;At baseline                                        General ADL Comments: no assist needed, no DME. pt reports she feels at baseline     Vision Baseline Vision/History: 1 Wears glasses Ability to See in Adequate Light: 0 Adequate Patient Visual Report: No change from baseline Vision Assessment?: Wears glasses for reading     Perception Perception: Within Functional Limits       Praxis Praxis: [facility]       Pertinent Vitals/Pain Pain Assessment Pain Assessment: No/denies pain     Extremity/Trunk Assessment Upper Extremity Assessment Upper Extremity Assessment: Overall WFL for tasks assessed   Lower Extremity Assessment Lower Extremity Assessment: Overall WFL for tasks assessed   Cervical / Trunk Assessment Cervical / Trunk Assessment: Normal   Communication Communication Communication: No apparent difficulties   Cognition Arousal: Alert Behavior During Therapy: WFL for tasks assessed/performed Cognition: No apparent impairments                               Following commands: Intact       Cueing  General Comments   Cueing Techniques: Verbal cues  VSS   Exercises     Shoulder Instructions  Home Living Family/patient expects to be discharged to:: Private residence Living Arrangements: Non-relatives/Friends Available Help at Discharge: Friend(s);Available PRN/intermittently Type of Home: House Home Access: Level entry     Home Layout: One level     Bathroom Shower/Tub: Chief Strategy Officer: Standard     Home Equipment: Grab bars - tub/shower          Prior Functioning/Environment Prior Level of Function : Independent/Modified Independent;Driving;Working/employed             Mobility Comments: indep 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 Problem List: Decreased activity tolerance   OT Treatment/Interventions:        OT Goals(Current goals can be found in the care plan section)   Acute Rehab OT Goals Patient Stated  Goal: home OT Goal Formulation: With patient Time For Goal Achievement: 10/13/24 Potential to Achieve Goals: Good   OT Frequency:       Co-evaluation              AM-PAC OT 6 Clicks Daily Activity     Outcome Measure Help from another person eating meals?: None Help from another person taking care of personal grooming?: None Help from another person toileting, which includes using toliet, bedpan, or urinal?: None Help from another person bathing (including washing, rinsing, drying)?: None Help from another person to put on and taking off regular upper body clothing?: None Help from another person to put on and taking off regular lower body clothing?: None 6 Click Score: 24   End of Session Nurse Communication: Mobility status  Activity Tolerance: Patient tolerated treatment well Patient left: in bed;with call bell/phone within reach  OT Visit Diagnosis: Muscle weakness (generalized) (M62.81)                Time: 8964-8894 OT Time Calculation (min): 30 min Charges:  OT General Charges $OT Visit: 1 Visit OT Evaluation $OT Eval Low Complexity: 1 Low OT Treatments $Self Care/Home Management : 8-22 mins  [name] [name], OTR/L Acute Rehabilitation Services Office [phone] Secure Chat Communication Preferred   [name] [name] 10/13/2024, 1:50 PM

## 2024-10-13 NOTE — Procedures (Signed)
 Patient Name: [name] [name]  MRN: [id number]  Epilepsy Attending: Arlin MALVA Krebs  Referring Physician/Provider: Sundil, Subrina, MD  Date: 10/13/2024 Duration: 22.24 mins  Patient history: 59 y.o. female with extensive small vessel disease, previous aneurysm clipping who presents with a transient episode of aphasia. EEG to evaluate for seizure.  Level of alertness: Awake, asleep  AEDs during EEG study: Ativan   Technical aspects: This EEG study was done with scalp electrodes positioned according to the 10-20 International system of electrode placement. Electrical activity was reviewed with band pass filter of 1-70Hz , sensitivity of 7 uV/mm, display speed of 39mm/sec with a 60Hz  notched filter applied as appropriate. EEG data were recorded continuously and digitally stored.  Video monitoring was available and reviewed as appropriate.  Description: The posterior dominant rhythm consists of 8 Hz activity of moderate voltage (25-35 uV) seen predominantly in posterior head regions, symmetric and reactive to eye opening and eye closing. Sleep was characterized by vertex waves, sleep spindles (12 to 14 Hz), maximal frontocentral region. There is intermittent generalized and maximal left parietal 3 to 6 Hz theta-delta slowing. Hyperventilation and photic stimulation were not performed.     ABNORMALITY - Intermittent slow, generalized and maximal left parietal  IMPRESSION: This study is suggestive of cortical dysfunction arising from left parietal region likely secondary to underlying structural abnormality. Additionally there is generalized cerebral dysfunction (encephalopathy). No seizures or epileptiform discharges were seen throughout the recording.  Shauntel Prest O Mathew Postiglione

## 2024-10-13 NOTE — ED Notes (Signed)
Staff at bedside for EEG. 

## 2024-10-14 ENCOUNTER — Other Ambulatory Visit (HOSPITAL_COMMUNITY): Payer: Self-pay

## 2024-10-14 DIAGNOSIS — G459 Transient cerebral ischemic attack, unspecified: Secondary | ICD-10-CM | POA: Insufficient documentation

## 2024-10-14 MED ORDER — EZETIMIBE 10 MG PO TABS
10.0000 mg | ORAL_TABLET | Freq: Every day | ORAL | 0 refills | Status: AC
  Filled 2024-10-14: qty 30, 30d supply, fill #0

## 2024-10-14 MED ORDER — ASPIRIN 81 MG PO TBEC
81.0000 mg | DELAYED_RELEASE_TABLET | Freq: Every day | ORAL | 0 refills | Status: AC
  Filled 2024-10-14: qty 90, 90d supply, fill #0

## 2024-10-14 MED ORDER — AMLODIPINE BESYLATE 10 MG PO TABS
10.0000 mg | ORAL_TABLET | Freq: Every day | ORAL | 0 refills | Status: AC
  Filled 2024-10-14: qty 90, 90d supply, fill #0

## 2024-10-14 NOTE — TOC Transition Note (Signed)
 Transition of Care [facility]) - Discharge Note   Patient Details  Name: [name] [name] MRN: [id number] Date of Birth: 17-Feb-1966  Transition of Care [facility]) CM/SW Contact:  Andrez JULIANNA George, RN Phone Number: 10/14/2024, 9:57 AM   Clinical Narrative:     Pt is discharging to home with self care. No PCP. PCP appt added to AVS.  Pt has transportation home.   Final next level of care: Home/Self Care Barriers to Discharge: No Barriers Identified   Patient Goals and CMS Choice            Discharge Placement                       Discharge Plan and Services Additional resources added to the After Visit Summary for                                       Social Drivers of Health (SDOH) Interventions SDOH Screenings   Food Insecurity: No Food Insecurity (10/14/2024)  Housing: Low Risk (10/14/2024)  Transportation Needs: No Transportation Needs (10/14/2024)  Utilities: Not At Risk (10/14/2024)  Tobacco Use: High Risk (10/12/2024)     Readmission Risk Interventions     No data to display

## 2024-10-14 NOTE — Plan of Care (Signed)

## 2024-10-14 NOTE — Discharge Summary (Signed)
 Physician Discharge Summary  [name] [name] FMW:[id number] DOB: 11-Jan-1966 DOA: 10/12/2024  PCP: [name] [name]  Admit date: 10/12/2024 Discharge date: 10/14/2024    Admitted From: Home Disposition: Home  Recommendations for Outpatient Follow-up:  Follow up with PCP in 1-2 weeks Please obtain BMP/CBC in one week Follow-up with [facility] neurology in 4 weeks Follow-up with lipid clinic as recommended by neurology. Please follow up with your PCP on the following pending results: Unresulted Labs (From admission, onward)     Start     Ordered   10/13/24 0842  Urine Drug Screen  ONCE - STAT,   STAT        10/13/24 0841   10/12/24 2035  HIV Antibody (routine testing w rflx)  (HIV Antibody (Routine testing w reflex) panel)  Add-on,   AD        10/12/24 2034              Home Health: None Equipment/Devices: None  Discharge Condition: Stable CODE STATUS: Full code Diet recommendation:  Diet Order             Diet Heart Room service appropriate? Yes; Fluid consistency: Thin  Diet effective now                   Subjective: Seen and examined, multiple family members, son, daughter, friend at the bedside.  Patient has no complaints.  She is excited to go home today.  Brief/Interim Summary: [name] [name] is a 59 y.o. female with medical history significant of intracranial aneurysm with prior subarachnoid hemorrhage requiring 2 previous coiling presented to  emergency department complaining of multiple issues include unable to getting words out from her mouth, right sided-sided facial droop, left-sided headache stabbing in quality yesterday. [name] patient she has history of ruptured aneurysm in the brain 22 years ago status post coiling.  No other complaint.   At presentation to ED patient was hypertensive blood pressure 226/133, otherwise hemodynamically stable.  Basic lab work was unremarkable.  CT head was concerning for subacute or chronic infarct in the high left peritoneal  lobe, MRI ruled out stroke.  Patient seen by neurology.  Concern for [name] [name].  Details below.   History of subarachnoid hemorrhage and intracranial aneurysm status post coiling now with [name] [name], POA:  CT head No acute abnormality.  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, moderate chronic microvascular ischemic changes, coiled right PCA aneurysm CTA head & neck no evidence of AVM, no LVO or hemodynamically significant stenosis, quelled right PCA aneurysm with small focus of residual opacification in neck of the aneurysm MRI with and without contrast left parietal edema and mild expansion without restricted diffusion, likely representing subacute or chronic infarct, right frontal lobe gliosis and moderate chronic microvascular ischemic changes, coiled right PCA aneurysm, numerous microhemorrhages, no abnormal enhancement seen on the images with contrast 2D Echo: EF 60 to 65%, grade 1 diastolic dysfunction, mitral valve normal, aortic valve normal, LA size normal, no shunt by color flow. LDL 244 HgbA1c 5.5 No antithrombotic prior to admission, I would start her on aspirin  81. Disposition: Cleared by PT to go home.  At baseline. EEG with no evidence of seizures-cortical dysfunction from left parietal region likely secondary to underlying abnormality and generalized cerebral dysfunction. Started on aspirin  81 mg by neurology.   Hypertensive emergency: No prior history of hypertension, presented with significantly elevated blood pressure 226/133.  Patient was not treated initially to allow  permissive hypertension however stroke is ruled out, started on amlodipine  5 mg yesterday, increased to 10 mg, blood pressure is much better today.  Discharging on amlodipine .    Hyperlipidemia: Total cholesterol 332, triglyceride 292, LDL 244, goal<70. patient refused to take statins, she is going to follow-up with the lipid clinic, she has been started on Zetia .    Tobacco abuse: I have discussed tobacco cessation with the patient.  I have counseled the patient regarding the negative impacts of continued tobacco use including but not limited to lung cancer, COPD, and cardiovascular disease.  I have discussed alternatives to tobacco and modalities that may help facilitate tobacco cessation including but not limited to biofeedback, hypnosis, and medications.  Total time spent with tobacco counseling was 5 minutes.    Discharge plan was discussed with patient and/or family member and they verbalized understanding and agreed with it.  Discharge Diagnoses:  Active Problems:   History of intracranial aneurysm   History of subarachnoid hemorrhage   Hypertensive emergency   [name] [name] (transient ischemic attack)    Discharge Instructions   Allergies as of 10/14/2024   No Known Allergies      Medication List     TAKE these medications    amLODipine  10 MG tablet Commonly known as: NORVASC  Take 1 tablet (10 mg total) by mouth daily.   aspirin  EC 81 MG tablet Take 1 tablet (81 mg total) by mouth daily. Swallow whole.   ezetimibe  10 MG tablet Commonly known as: ZETIA  Take 1 tablet (10 mg total) by mouth daily.        Follow-up Information     PCP Follow up in 1 week(s).                 Allergies[1]  Consultations: Neurology   Procedures/Studies: ECHOCARDIOGRAM COMPLETE Result Date: 10/13/2024    ECHOCARDIOGRAM REPORT   Patient Name:   [name] [name] Date of Exam: 10/13/2024 Medical Rec #:  [id number]   Height:       61.0 in Accession #:    [id number]  Weight:       125.0 lb Date of Birth:  [birth year]   BSA:          1.547 m Patient Age:    58 years    BP:           136/93 mmHg Patient Gender: F           HR:           90 bpm. Exam Location:  Inpatient Procedure: 2D Echo, Cardiac Doppler and Color Doppler (Both Spectral and Color            Flow Doppler were utilized during procedure). Indications:    Stroke I63.9  History:        Patient has  no prior history of Echocardiogram examinations.  Sonographer:    [name] [name] RDCS Referring Phys: [phone] [name] [name] IMPRESSIONS  1. Left ventricular ejection fraction, by estimation, is 60 to 65%. The left ventricle has normal function. The left ventricle has no regional wall motion abnormalities. Left ventricular diastolic parameters are consistent with Grade I diastolic dysfunction (impaired relaxation).  2. Right ventricular systolic function is normal. The right ventricular size is normal. Tricuspid regurgitation signal is inadequate for assessing PA pressure.  3. The mitral valve is normal in structure. No evidence of mitral valve regurgitation. No evidence of mitral stenosis.  4. The aortic valve is normal in structure. Aortic valve regurgitation is not visualized. No  aortic stenosis is present.  5. The inferior vena cava is normal in size with greater than 50% respiratory variability, suggesting right atrial pressure of 3 mmHg. Conclusion(s)/Recommendation(s): No intracardiac source of embolism detected on this transthoracic study. Consider a transesophageal echocardiogram to exclude cardiac source of embolism if clinically indicated. FINDINGS  Left Ventricle: Left ventricular ejection fraction, by estimation, is 60 to 65%. The left ventricle has normal function. The left ventricle has no regional wall motion abnormalities. The left ventricular internal cavity size was normal in size. There is  no left ventricular hypertrophy. Left ventricular diastolic parameters are consistent with Grade I diastolic dysfunction (impaired relaxation). Right Ventricle: The right ventricular size is normal. No increase in right ventricular wall thickness. Right ventricular systolic function is normal. Tricuspid regurgitation signal is inadequate for assessing PA pressure. Left Atrium: Left atrial size was normal in size. Right Atrium: Right atrial size was normal in size. Pericardium: There is no evidence of  pericardial effusion. Mitral Valve: The mitral valve is normal in structure. No evidence of mitral valve regurgitation. No evidence of mitral valve stenosis. Tricuspid Valve: The tricuspid valve is normal in structure. Tricuspid valve regurgitation is not demonstrated. No evidence of tricuspid stenosis. Aortic Valve: The aortic valve is normal in structure. Aortic valve regurgitation is not visualized. No aortic stenosis is present. Aortic valve mean gradient measures 4.0 mmHg. Aortic valve peak gradient measures 8.1 mmHg. Aortic valve area, by VTI measures 2.70 cm. Pulmonic Valve: The pulmonic valve was normal in structure. Pulmonic valve regurgitation is not visualized. No evidence of pulmonic stenosis. Aorta: The aortic root is normal in size and structure. Venous: The inferior vena cava is normal in size with greater than 50% respiratory variability, suggesting right atrial pressure of 3 mmHg. IAS/Shunts: No atrial level shunt detected by color flow Doppler.  LEFT VENTRICLE PLAX 2D LVIDd:         3.60 cm     Diastology LVIDs:         2.50 cm     LV e' medial:    8.27 cm/s LV PW:         0.90 cm     LV E/e' medial:  9.4 LV IVS:        1.00 cm     LV e' lateral:   7.29 cm/s LVOT diam:     2.00 cm     LV E/e' lateral: 10.7 LV SV:         69 LV SV Index:   45 LVOT Area:     3.14 cm  LV Volumes (MOD) LV vol d, MOD A2C: 75.4 ml LV vol d, MOD A4C: 63.6 ml LV vol s, MOD A2C: 26.8 ml LV vol s, MOD A4C: 25.2 ml LV SV MOD A2C:     48.6 ml LV SV MOD A4C:     63.6 ml LV SV MOD BP:      46.3 ml RIGHT VENTRICLE RV S prime:     14.70 cm/s TAPSE (M-mode): 2.0 cm LEFT ATRIUM             Index        RIGHT ATRIUM          Index LA diam:        2.20 cm 1.42 cm/m   RA Area:     7.97 cm LA Vol (A2C):   24.5 ml 15.84 ml/m  RA Volume:   13.40 ml 8.66 ml/m LA Vol (A4C):   12.3 ml  7.95 ml/m LA Biplane Vol: 17.8 ml 11.51 ml/m  AORTIC VALVE AV Area (Vmax):    2.14 cm AV Area (Vmean):   2.07 cm AV Area (VTI):     2.70 cm AV Vmax:            142.00 cm/s AV Vmean:          94.100 cm/s AV VTI:            0.257 m AV Peak Grad:      8.1 mmHg AV Mean Grad:      4.0 mmHg LVOT Vmax:         96.60 cm/s LVOT Vmean:        61.900 cm/s LVOT VTI:          0.221 m LVOT/AV VTI ratio: 0.86  AORTA Ao Root diam: 2.50 cm Ao Asc diam:  2.40 cm MITRAL VALVE MV Area (PHT): 1.98 cm     SHUNTS MV Decel Time: 383 msec     Systemic VTI:  0.22 m MV E velocity: 78.10 cm/s   Systemic Diam: 2.00 cm MV A velocity: 122.00 cm/s MV E/A ratio:  0.64 Mihai Croitoru MD Electronically signed by Jerel Balding MD Signature Date/Time: 10/13/2024/3:18:30 PM    Final    EEG adult Result Date: 10/13/2024 Shelton Arlin KIDD, MD     10/13/2024 12:18 PM Patient Name: Kadince Boxley MRN: [id number] Epilepsy Attending: Arlin KIDD Shelton Referring Physician/Provider: Sundil, Subrina, MD Date: 10/13/2024 Duration: 22.24 mins Patient history: 59 y.o. female with extensive small vessel disease, previous aneurysm clipping who presents with a transient episode of aphasia. EEG to evaluate for seizure. Level of alertness: Awake, asleep AEDs during EEG study: Ativan  Technical aspects: This EEG study was done with scalp electrodes positioned according to the 10-20 International system of electrode placement. Electrical activity was reviewed with band pass filter of 1-70Hz , sensitivity of 7 uV/mm, display speed of 54mm/sec with a 60Hz  notched filter applied as appropriate. EEG data were recorded continuously and digitally stored.  Video monitoring was available and reviewed as appropriate. Description: The posterior dominant rhythm consists of 8 Hz activity of moderate voltage (25-35 uV) seen predominantly in posterior head regions, symmetric and reactive to eye opening and eye closing. Sleep was characterized by vertex waves, sleep spindles (12 to 14 Hz), maximal frontocentral region. There is intermittent generalized and maximal left parietal 3 to 6 Hz theta-delta slowing. Hyperventilation and photic  stimulation were not performed.   ABNORMALITY - Intermittent slow, generalized and maximal left parietal IMPRESSION: This study is suggestive of cortical dysfunction arising from left parietal region likely secondary to underlying structural abnormality. Additionally there is generalized cerebral dysfunction (encephalopathy). No seizures or epileptiform discharges were seen throughout the recording. Priyanka O Yadav   MR BRAIN W CONTRAST Result Date: 10/13/2024 EXAM: MRI BRAIN WITH CONTRAST 10/13/2024 02:36:00 AM TECHNIQUE: Multiplanar multisequence MRI of the head/brain was performed with the administration of intravenous contrast. CONTRAST: 5 mL Gadavist  COMPARISON: None available. CLINICAL HISTORY: Vasculitis suspected, CNS FINDINGS: Post-contrast imaging performed to further evaluate abnormality seen on MRI head from yesterday. No abnormal enhancement identified. Dural venous sinuses are patent. IMPRESSION: 1. No abnormal enhancement. Electronically signed by: Glendia Molt MD 10/13/2024 02:50 AM EST RP Workstation: [id number]   CT ANGIO HEAD NECK W WO CM Result Date: 10/12/2024 EXAM: CTA HEAD AND NECK WITH AND WITHOUT 10/12/2024 09:54:42 PM TECHNIQUE: CTA of the head and neck was performed with and without the administration of intravenous contrast. Multiplanar 2D and/or  3D reformatted images are provided for review. Automated exposure control, iterative reconstruction, and/or weight based adjustment of the mA/kV was utilized to reduce the radiation dose to as low as reasonably achievable. Stenosis of the internal carotid arteries measured using NASCET criteria. COMPARISON: None available CLINICAL HISTORY: Stroke, follow up FINDINGS: AORTIC ARCH AND ARCH VESSELS: Limited evaluation due to streak artifact from old venous contrast. Great vessel origins are patent. CERVICAL CAROTID ARTERIES: No dissection, arterial injury, or hemodynamically significant stenosis by NASCET criteria. Left greater than right  carotid bifurcation atherosclerosis without greater than 50% stenosis. CERVICAL VERTEBRAL ARTERIES: Limited evaluation of the proximal right vertebral artery due to streak artifact. Within this limitation, no dissection, arterial injury, or significant stenosis. LUNGS AND MEDIASTINUM: Unremarkable. SOFT TISSUES: No acute abnormality. BONES: No acute abnormality. ANTERIOR CIRCULATION: No significant stenosis of the internal carotid arteries. Apparent stents at the carotid termini bilaterally. Coiled right posterior communicating artery aneurysm with approximately x 3 mm focus of residual opacification/contrast in the neck of the aneurysm (for example, see series 5 image 96)next. No significant stenosis of the anterior cerebral arteries. No significant stenosis of the middle cerebral arteries. POSTERIOR CIRCULATION: Limited evaluation of the proximal right posterior cerebral artery due to streak artifact from adjacent coiled mass. Within this limitation, no significant stenosis of the posterior cerebral arteries. No significant stenosis of the basilar artery. No significant stenosis of the vertebral arteries. No aneurysm. OTHER: No dural venous sinus thrombosis on this non-dedicated study. IMPRESSION: 1. No evidence of AVM in the left parietal abnormality seen on same day MRI. 2. No large vessel occlusion or hemodynamically significant stenosis. 3. Coiled right posterior communicating artery aneurysm with approximately 5 x 3 mm focus of residual opacification/contrast in the neck of the aneurysm. Electronically signed by: Glendia Molt MD 10/12/2024 10:39 PM EST RP Workstation: [id number]   MR BRAIN WO CONTRAST Result Date: 10/12/2024 EXAM: MRI Brain Without Contrast 10/12/2024 07:36:36 PM TECHNIQUE: Multiplanar multisequence MRI of the head/brain was performed without the administration of intravenous contrast. COMPARISON: None available. CLINICAL HISTORY: Stroke, follow up; Transient ischemic attack ([place]) FINDINGS:  BRAIN AND VENTRICLES: No acute infarct. Left parietal edema without correlate restricted diffusion. Mild expansion of cortex in this area with many small foci of susceptibility artifact some of which may be tubular. Right frontal lobe gliosis and moderate scattered T2 hyperintensities in the white matter compatible with chronic microvascular ischemic change. No acute intracranial hemorrhage. No midline shift. No hydrocephalus. The sella is unremarkable. Coiled right posterior communicating artery aneurysm. ORBITS: No significant abnormality. SINUSES AND MASTOIDS: No significant abnormality. BONES AND SOFT TISSUES: Normal marrow signal. No soft tissue abnormality. IMPRESSION: 1. Left parietal edema and mild expansion without correlate restricted diffusion. While this most likely represents evolving subacute to chronic infarct, the complete absence of restricted diffusion is somewhat atypical and recommend post-contrast imaging to help exclude malignancy. Also, areas of susceptibility artifact in this region may represent petechial hemorrhage, but recommend CTA head to help exclude underlying vascular malformation. 2. Right frontal lobe gliosis and moderate chronic microvascular ischemic change. 3. Coiled right posterior communicating artery aneurysm. Electronically signed by: Glendia Molt MD 10/12/2024 08:12 PM EST RP Workstation: [id number]   CT HEAD WO CONTRAST Result Date: 10/12/2024 EXAM: CT HEAD WITHOUT CONTRAST 10/12/2024 02:17:00 PM TECHNIQUE: CT of the head was performed without the administration of intravenous contrast. Automated exposure control, iterative reconstruction, and/or weight based adjustment of the mA/kV was utilized to reduce the radiation dose to as low as reasonably achievable. COMPARISON:  01/23/2018 CLINICAL HISTORY: headache, episode of left sided facial droop and trouble with word finding yesterday, hx of aneurysm headache, episode of left sided facial droop and trouble with word finding  yesterday, hx of aneurysm headache, episode of left sided facial droop and trouble with word finding yesterday, hx of aneurysm FINDINGS: BRAIN AND VENTRICLES: No acute hemorrhage. Moderate chronic microvascular ischemic change. 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 Subacute to chronic appearing infarct in the high left parietal lobe. Coils noted in the right parasellar region compatible with history of treated posterior communicating artery aneurysm. Streak artifact from the aneurysm coil slightly limits evaluation of the basilar cisterns and the inferior aspects of the cerebellar hemispheres. There is a partially visualized stent in the left ICA. No hydrocephalus. No extra-axial collection. No mass effect or midline shift. ORBITS: No acute abnormality. SINUSES: No acute abnormality. SOFT TISSUES AND SKULL: No acute soft tissue abnormality. No skull fracture. IMPRESSION: 1. No acute intracranial abnormality. 2. Subacute to chronic appearing infarct in the high left parietal lobe. Consider MRI for further evaluation. 3. 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 4. Moderate chronic microvascular ischemic change. 5. Coiled right posterior communicating artery aneurysm. 6. Partially visualized stent in the left ICA. Electronically signed by: Donnice Mania MD 10/12/2024 02:26 PM EST RP Workstation: [id number]     Discharge Exam: Vitals:   10/14/24 0329 10/14/24 0519  BP: (!) 167/88 (!) 153/89  Pulse: 75 78  Resp: 16   Temp: 97.9 F (36.6 C)   SpO2: 94%    Vitals:   10/14/24 0000 10/14/24 0028 10/14/24 0329 10/14/24 0519  BP: (!) 158/82 (!) 150/96 (!) 167/88 (!) 153/89  Pulse: 68 80 75 78  Resp: (!) 27 18 16    Temp: 97.8 F (36.6 C) 98.1 F (36.7 C) 97.9 F (36.6 C)   TempSrc: Oral Oral Oral   SpO2: 100% 97% 94%   Weight:      Height:        General: Pt is alert, awake, not in acute distress Cardiovascular: RRR, S1/S2 +, no rubs, no gallops Respiratory: CTA bilaterally, no wheezing, no  rhonchi Abdominal: Soft, NT, ND, bowel sounds + Extremities: no edema, no cyanosis    The results of significant diagnostics from this hospitalization (including imaging, microbiology, ancillary and laboratory) are listed below for reference.     Microbiology: No results found for this or any previous visit (from the past 240 hours).   Labs: BNP (last 3 results) No results for input(s): BNP in the last 8760 hours. Basic Metabolic Panel: Recent Labs  Lab 10/12/24 1351 10/12/24 1408 10/13/24 0540  NA 139 141 139  K 4.1 4.2 3.9  CL 102 104 104  CO2 25  --  22  GLUCOSE 104* 110* 81  BUN 11 12 8   CREATININE 0.79 0.90 0.72  CALCIUM  10.1  --  9.6   Liver Function Tests: Recent Labs  Lab 10/12/24 1351 10/13/24 0540  AST 25 25  ALT 12 11  ALKPHOS 132* 110  BILITOT 0.5 0.5  PROT 8.1 7.2  ALBUMIN 4.4 4.0   No results for input(s): LIPASE, AMYLASE in the last 168 hours. No results for input(s): AMMONIA in the last 168 hours. CBC: Recent Labs  Lab 10/12/24 1351 10/12/24 1408 10/13/24 0540  WBC 9.2  --  7.7  NEUTROABS 6.1  --   --   HGB 16.1* 15.6* 14.9  HCT 47.7* 46.0 44.7  MCV 86.1  --  86.5  PLT 252  --  201   Cardiac Enzymes: No results for input(s): CKTOTAL, CKMB, CKMBINDEX, TROPONINI in the last 168 hours. BNP: Invalid input(s): POCBNP CBG: Recent Labs  Lab 10/12/24 1602  GLUCAP 102*   D-Dimer No results for input(s): DDIMER in the last 72 hours. Hgb A1c Recent Labs    10/12/24 1351  HGBA1C 5.5   Lipid Profile Recent Labs    10/13/24 0550  CHOL 332*  HDL 29*  LDLCALC 244*  TRIG 292*  CHOLHDL 11.3   Thyroid function studies No results for input(s): TSH, T4TOTAL, T3FREE, THYROIDAB in the last 72 hours.  Invalid input(s): FREET3 Anemia work up No results for input(s): VITAMINB12, FOLATE, FERRITIN, TIBC, IRON, RETICCTPCT in the last 72 hours. Urinalysis No results found for: COLORURINE,  APPEARANCEUR, LABSPEC, PHURINE, GLUCOSEU, HGBUR, BILIRUBINUR, KETONESUR, PROTEINUR, UROBILINOGEN, NITRITE, LEUKOCYTESUR Sepsis Labs Recent Labs  Lab 10/12/24 1351 10/13/24 0540  WBC 9.2 7.7   Microbiology No results found for this or any previous visit (from the past 240 hours).  FURTHER DISCHARGE INSTRUCTIONS:   Get Medicines reviewed and adjusted: Please take all your medications with you for your next visit with your Primary MD   Laboratory/radiological data: Please request your Primary MD to go over all hospital tests and procedure/radiological results at the follow up, please ask your Primary MD to get all Hospital records sent to his/her office.   In some cases, they will be blood work, cultures and biopsy results pending at the time of your discharge. Please request that your primary care M.D. goes through all the records of your hospital data and follows up on these results.   Also Note the following: If you experience worsening of your admission symptoms, develop shortness of breath, life threatening emergency, suicidal or homicidal thoughts you must seek medical attention immediately by calling 911 or calling your MD immediately  if symptoms less severe.   You must read complete instructions/literature along with all the possible adverse reactions/side effects for all the Medicines you take and that have been prescribed to you. Take any new Medicines after you have completely understood and accpet all the possible adverse reactions/side effects.    patient was instructed, not to drive, operate heavy machinery, perform activities at heights, swimming or participation in water activities or provide baby-sitting services while on Pain, Sleep and Anxiety Medications; until their outpatient Physician has advised to do so again. Also recommended to not to take more than prescribed Pain, Sleep and Anxiety Medications.  It is not advisable to combine anxiety, sleep  and pain medications without talking with your primary care provider.     Wear Seat belts while driving.   Please note: You were cared for by a hospitalist during your hospital stay. Once you are discharged, your primary care physician will handle any further medical issues. Please note that NO REFILLS for any discharge medications will be authorized once you are discharged, as it is imperative that you return to your primary care physician (or establish a relationship with a primary care physician if you do not have one) for your post hospital discharge needs so that they can reassess your need for medications and monitor your lab values  Time coordinating discharge: Over 30 minutes  SIGNED:   Fredia Skeeter, MD  [facility] 10/14/2024, 8:01 AM *Please note that this is a verbal dictation therefore any spelling or grammatical errors are due to the [employer] system interpretation. If 7PM-7AM, please contact [email or web address] [email or web address]     [1] No Known  Allergies

## 2024-11-30 ENCOUNTER — Ambulatory Visit: Payer: Self-pay | Admitting: Nurse Practitioner

## 2024-12-02 ENCOUNTER — Ambulatory Visit: Payer: MEDICAID | Admitting: Neurology
